# Patient Record
Sex: Female | Born: 1980 | Race: Black or African American | Hispanic: No | Marital: Single | Smoking: Former smoker
Health system: Southern US, Community
[De-identification: ages and names within clinical notes are randomized; demographics above are authoritative.]

## PROBLEM LIST (undated history)

## (undated) DIAGNOSIS — R519 Headache, unspecified: Secondary | ICD-10-CM

## (undated) DIAGNOSIS — Z87891 Personal history of nicotine dependence: Secondary | ICD-10-CM

## (undated) DIAGNOSIS — M1612 Unilateral primary osteoarthritis, left hip: Secondary | ICD-10-CM

## (undated) DIAGNOSIS — G8929 Other chronic pain: Secondary | ICD-10-CM

## (undated) DIAGNOSIS — N83201 Unspecified ovarian cyst, right side: Secondary | ICD-10-CM

## (undated) DIAGNOSIS — G4700 Insomnia, unspecified: Secondary | ICD-10-CM

## (undated) DIAGNOSIS — Z8669 Personal history of other diseases of the nervous system and sense organs: Secondary | ICD-10-CM

## (undated) DIAGNOSIS — M87059 Idiopathic aseptic necrosis of unspecified femur: Secondary | ICD-10-CM

## (undated) DIAGNOSIS — E538 Deficiency of other specified B group vitamins: Secondary | ICD-10-CM

## (undated) DIAGNOSIS — E559 Vitamin D deficiency, unspecified: Secondary | ICD-10-CM

## (undated) DIAGNOSIS — M25559 Pain in unspecified hip: Secondary | ICD-10-CM

## (undated) DIAGNOSIS — D649 Anemia, unspecified: Secondary | ICD-10-CM

## (undated) DIAGNOSIS — M1990 Unspecified osteoarthritis, unspecified site: Secondary | ICD-10-CM

## (undated) DIAGNOSIS — N289 Disorder of kidney and ureter, unspecified: Secondary | ICD-10-CM

## (undated) DIAGNOSIS — R1900 Intra-abdominal and pelvic swelling, mass and lump, unspecified site: Secondary | ICD-10-CM

## (undated) HISTORY — PX: BREAST BIOPSY: SHX20

## (undated) HISTORY — PX: KIDNEY SURGERY: SHX687

## (undated) HISTORY — PX: ABDOMINAL HYSTERECTOMY: SHX81

---
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
## 2004-03-02 ENCOUNTER — Emergency Department: Payer: Self-pay | Admitting: Emergency Medicine

## 2004-04-10 ENCOUNTER — Emergency Department: Payer: Self-pay | Admitting: General Practice

## 2004-04-25 ENCOUNTER — Emergency Department: Payer: Self-pay | Admitting: General Practice

## 2004-06-12 ENCOUNTER — Emergency Department: Payer: Self-pay | Admitting: Emergency Medicine

## 2004-07-18 ENCOUNTER — Inpatient Hospital Stay: Payer: Self-pay | Admitting: Obstetrics and Gynecology

## 2004-11-22 ENCOUNTER — Emergency Department: Payer: Self-pay | Admitting: General Practice

## 2005-11-09 ENCOUNTER — Emergency Department: Payer: Self-pay | Admitting: Emergency Medicine

## 2006-02-04 ENCOUNTER — Emergency Department: Payer: Self-pay | Admitting: Emergency Medicine

## 2006-03-15 ENCOUNTER — Emergency Department: Payer: Self-pay | Admitting: Internal Medicine

## 2006-04-14 ENCOUNTER — Emergency Department: Payer: Self-pay | Admitting: Emergency Medicine

## 2006-06-24 ENCOUNTER — Emergency Department: Payer: Self-pay | Admitting: Unknown Physician Specialty

## 2006-06-26 ENCOUNTER — Emergency Department: Payer: Self-pay | Admitting: Internal Medicine

## 2006-08-16 ENCOUNTER — Emergency Department: Payer: Self-pay | Admitting: Unknown Physician Specialty

## 2006-10-18 ENCOUNTER — Emergency Department: Payer: Self-pay | Admitting: Emergency Medicine

## 2007-02-03 ENCOUNTER — Emergency Department: Payer: Self-pay

## 2007-03-14 ENCOUNTER — Emergency Department: Payer: Self-pay | Admitting: Emergency Medicine

## 2007-03-25 ENCOUNTER — Emergency Department: Payer: Self-pay | Admitting: Emergency Medicine

## 2007-05-09 ENCOUNTER — Emergency Department: Payer: Self-pay | Admitting: Emergency Medicine

## 2007-05-23 ENCOUNTER — Emergency Department: Payer: Self-pay | Admitting: Internal Medicine

## 2007-05-25 ENCOUNTER — Emergency Department: Payer: Self-pay | Admitting: Emergency Medicine

## 2007-06-22 ENCOUNTER — Observation Stay: Payer: Self-pay

## 2007-07-15 ENCOUNTER — Observation Stay: Payer: Self-pay | Admitting: Obstetrics and Gynecology

## 2007-07-19 ENCOUNTER — Inpatient Hospital Stay: Payer: Self-pay | Admitting: Obstetrics and Gynecology

## 2008-03-10 ENCOUNTER — Emergency Department: Payer: Self-pay | Admitting: Unknown Physician Specialty

## 2008-11-05 ENCOUNTER — Emergency Department: Payer: Self-pay | Admitting: Emergency Medicine

## 2010-03-24 ENCOUNTER — Emergency Department: Payer: Self-pay | Admitting: Emergency Medicine

## 2010-04-19 ENCOUNTER — Emergency Department: Payer: Self-pay | Admitting: Emergency Medicine

## 2010-04-30 ENCOUNTER — Emergency Department: Payer: Self-pay | Admitting: Emergency Medicine

## 2010-05-24 ENCOUNTER — Ambulatory Visit: Payer: Self-pay | Admitting: General Surgery

## 2010-05-26 LAB — PATHOLOGY REPORT

## 2010-05-27 ENCOUNTER — Emergency Department: Payer: Self-pay | Admitting: Internal Medicine

## 2010-06-10 ENCOUNTER — Ambulatory Visit: Payer: Self-pay

## 2010-06-14 ENCOUNTER — Ambulatory Visit: Payer: Self-pay | Admitting: Internal Medicine

## 2010-06-22 ENCOUNTER — Ambulatory Visit: Payer: Self-pay | Admitting: Internal Medicine

## 2010-07-13 ENCOUNTER — Emergency Department: Payer: Self-pay | Admitting: Emergency Medicine

## 2010-08-04 ENCOUNTER — Emergency Department: Payer: Self-pay | Admitting: Unknown Physician Specialty

## 2010-08-11 ENCOUNTER — Emergency Department: Payer: Self-pay | Admitting: Emergency Medicine

## 2010-09-20 ENCOUNTER — Ambulatory Visit: Payer: Self-pay

## 2010-10-15 ENCOUNTER — Emergency Department: Payer: Self-pay | Admitting: Emergency Medicine

## 2010-11-12 ENCOUNTER — Emergency Department: Payer: Self-pay | Admitting: Internal Medicine

## 2010-11-19 ENCOUNTER — Emergency Department: Payer: Self-pay | Admitting: Emergency Medicine

## 2011-05-07 ENCOUNTER — Emergency Department: Payer: Self-pay | Admitting: *Deleted

## 2011-05-07 LAB — CBC
HCT: 38.8 % (ref 35.0–47.0)
HGB: 13.1 g/dL (ref 12.0–16.0)
MCHC: 33.7 g/dL (ref 32.0–36.0)
MCV: 101 fL — ABNORMAL HIGH (ref 80–100)
Platelet: 285 10*3/uL (ref 150–440)
RDW: 12.9 % (ref 11.5–14.5)
WBC: 4.8 10*3/uL (ref 3.6–11.0)

## 2011-05-07 LAB — URINALYSIS, COMPLETE
Glucose,UR: NEGATIVE mg/dL (ref 0–75)
Ketone: NEGATIVE
Nitrite: POSITIVE
Ph: 7 (ref 4.5–8.0)
Protein: NEGATIVE
RBC,UR: 1 /HPF (ref 0–5)
Squamous Epithelial: 3
WBC UR: 1 /HPF (ref 0–5)

## 2011-05-07 LAB — COMPREHENSIVE METABOLIC PANEL
Albumin: 4.2 g/dL (ref 3.4–5.0)
Alkaline Phosphatase: 71 U/L (ref 50–136)
BUN: 12 mg/dL (ref 7–18)
Bilirubin,Total: 0.7 mg/dL (ref 0.2–1.0)
EGFR (Non-African Amer.): >60
Glucose: 85 mg/dL (ref 65–99)
Osmolality: 275 (ref 275–301)
Potassium: 3.8 mmol/L (ref 3.5–5.1)
SGPT (ALT): 20 U/L
Sodium: 138 mmol/L (ref 136–145)
Total Protein: 7.9 g/dL (ref 6.4–8.2)

## 2011-05-07 LAB — LIPASE, BLOOD: Lipase: 96 U/L (ref 73–393)

## 2011-05-07 LAB — PREGNANCY, URINE: Pregnancy Test, Urine: NEGATIVE m[IU]/mL

## 2011-08-01 ENCOUNTER — Emergency Department: Payer: Self-pay

## 2011-08-01 LAB — URINALYSIS, COMPLETE
Bacteria: NONE SEEN
Glucose,UR: NEGATIVE mg/dL (ref 0–75)
Protein: NEGATIVE
Squamous Epithelial: 1

## 2011-08-16 ENCOUNTER — Ambulatory Visit: Payer: Self-pay

## 2011-10-26 ENCOUNTER — Emergency Department: Payer: Self-pay | Admitting: Emergency Medicine

## 2012-02-18 ENCOUNTER — Emergency Department: Payer: Self-pay | Admitting: Emergency Medicine

## 2012-02-18 LAB — BASIC METABOLIC PANEL
Anion Gap: 6 — ABNORMAL LOW (ref 7–16)
Calcium, Total: 8.9 mg/dL (ref 8.5–10.1)
Chloride: 106 mmol/L (ref 98–107)
Co2: 27 mmol/L (ref 21–32)
EGFR (African American): >60
Glucose: 98 mg/dL (ref 65–99)

## 2012-02-18 LAB — CBC
HGB: 14 g/dL (ref 12.0–16.0)
MCH: 35.2 pg — ABNORMAL HIGH (ref 26.0–34.0)
MCHC: 35.1 g/dL (ref 32.0–36.0)
Platelet: 305 10*3/uL (ref 150–440)
RBC: 3.97 10*6/uL (ref 3.80–5.20)
RDW: 12.3 % (ref 11.5–14.5)
WBC: 5.1 10*3/uL (ref 3.6–11.0)

## 2012-02-18 LAB — TSH: Thyroid Stimulating Horm: 0.469 u[IU]/mL

## 2012-08-21 ENCOUNTER — Emergency Department: Payer: Self-pay | Admitting: Emergency Medicine

## 2012-08-21 LAB — COMPREHENSIVE METABOLIC PANEL
Albumin: 4 g/dL (ref 3.4–5.0)
Anion Gap: 6 — ABNORMAL LOW (ref 7–16)
Bilirubin,Total: 0.3 mg/dL (ref 0.2–1.0)
Chloride: 106 mmol/L (ref 98–107)
Co2: 25 mmol/L (ref 21–32)
Creatinine: 0.76 mg/dL (ref 0.60–1.30)
EGFR (African American): >60
Glucose: 88 mg/dL (ref 65–99)
Osmolality: 272 (ref 275–301)
Potassium: 3.7 mmol/L (ref 3.5–5.1)
Total Protein: 7.4 g/dL (ref 6.4–8.2)

## 2012-08-21 LAB — URINALYSIS, COMPLETE
Ketone: NEGATIVE
Nitrite: POSITIVE
Ph: 6 (ref 4.5–8.0)
RBC,UR: 1 /HPF (ref 0–5)
Specific Gravity: 1.02 (ref 1.003–1.030)
Squamous Epithelial: 2

## 2012-08-21 LAB — CBC
HCT: 39.8 % (ref 35.0–47.0)
HGB: 13.7 g/dL (ref 12.0–16.0)
MCH: 33.8 pg (ref 26.0–34.0)
MCV: 98 fL (ref 80–100)
RDW: 12.5 % (ref 11.5–14.5)
WBC: 7.9 10*3/uL (ref 3.6–11.0)

## 2012-08-22 LAB — PROTIME-INR
INR: 1
Prothrombin Time: 13 s

## 2012-08-22 LAB — CK: CK, Total: 96 U/L

## 2012-08-22 LAB — PREGNANCY, URINE: Pregnancy Test, Urine: NEGATIVE m[IU]/mL

## 2012-08-22 LAB — APTT: Activated PTT: 30.1 s

## 2013-06-06 ENCOUNTER — Emergency Department: Payer: Self-pay | Admitting: Emergency Medicine

## 2013-06-06 LAB — COMPREHENSIVE METABOLIC PANEL
ALT: 17 U/L (ref 12–78)
ANION GAP: 7 (ref 7–16)
AST: 11 U/L — AB (ref 15–37)
Albumin: 3.8 g/dL (ref 3.4–5.0)
Alkaline Phosphatase: 68 U/L
BILIRUBIN TOTAL: 0.7 mg/dL (ref 0.2–1.0)
BUN: 13 mg/dL (ref 7–18)
CALCIUM: 8.7 mg/dL (ref 8.5–10.1)
CO2: 24 mmol/L (ref 21–32)
Chloride: 107 mmol/L (ref 98–107)
Creatinine: 0.92 mg/dL (ref 0.60–1.30)
EGFR (African American): >60
Glucose: 94 mg/dL (ref 65–99)
OSMOLALITY: 276 (ref 275–301)
Potassium: 3.7 mmol/L (ref 3.5–5.1)
SODIUM: 138 mmol/L (ref 136–145)
Total Protein: 7.3 g/dL (ref 6.4–8.2)

## 2013-06-06 LAB — CBC WITH DIFFERENTIAL/PLATELET
Basophil #: 0.1 x10 3/mm 3
Basophil %: 0.7 %
Eosinophil #: 0.1 x10 3/mm 3
Eosinophil %: 1.1 %
HCT: 38.7 %
HGB: 13.1 g/dL
Lymphocyte %: 24.3 %
Lymphs Abs: 2.3 x10 3/mm 3
MCH: 33.9 pg
MCHC: 33.8 g/dL
MCV: 100 fL
Monocyte #: 0.6 "x10 3/mm "
Monocyte %: 6.7 %
Neutrophil #: 6.5 x10 3/mm 3
Neutrophil %: 67.2 %
Platelet: 246 x10 3/mm 3
RBC: 3.86 X10 6/mm 3
RDW: 12.8 %
WBC: 9.6 x10 3/mm 3

## 2013-06-06 LAB — URINALYSIS, COMPLETE
Bilirubin,UR: NEGATIVE
Blood: NEGATIVE
Glucose,UR: NEGATIVE mg/dL
Ketone: NEGATIVE
Nitrite: NEGATIVE
Ph: 6
Protein: NEGATIVE
RBC,UR: 3 /HPF
Specific Gravity: 1.023
Squamous Epithelial: 3
WBC UR: 5 /HPF

## 2013-06-06 LAB — WET PREP, GENITAL

## 2013-06-06 LAB — TROPONIN I: Troponin-I: <0.02 ng/mL

## 2013-06-06 LAB — GC/CHLAMYDIA PROBE AMP

## 2013-06-06 LAB — LIPASE, BLOOD: Lipase: 117 U/L (ref 73–393)

## 2014-03-30 ENCOUNTER — Emergency Department: Payer: Self-pay | Admitting: Emergency Medicine

## 2014-03-30 LAB — COMPREHENSIVE METABOLIC PANEL
ALK PHOS: 82 U/L
ALT: 19 U/L
Albumin: 4 g/dL (ref 3.4–5.0)
Anion Gap: 11 (ref 7–16)
BILIRUBIN TOTAL: 0.7 mg/dL (ref 0.2–1.0)
BUN: 7 mg/dL (ref 7–18)
Calcium, Total: 8.7 mg/dL (ref 8.5–10.1)
Chloride: 106 mmol/L (ref 98–107)
Co2: 20 mmol/L — ABNORMAL LOW (ref 21–32)
Creatinine: 0.67 mg/dL (ref 0.60–1.30)
Glucose: 125 mg/dL — ABNORMAL HIGH (ref 65–99)
Osmolality: 273 (ref 275–301)
Potassium: 3.3 mmol/L — ABNORMAL LOW (ref 3.5–5.1)
SGOT(AST): 21 U/L (ref 15–37)
Sodium: 137 mmol/L (ref 136–145)
TOTAL PROTEIN: 7.6 g/dL (ref 6.4–8.2)

## 2014-03-30 LAB — CBC WITH DIFFERENTIAL/PLATELET
BASOS PCT: 0.6 %
Basophil #: 0 10*3/uL (ref 0.0–0.1)
Eosinophil #: 0.1 10*3/uL (ref 0.0–0.7)
Eosinophil %: 1 %
HCT: 38.8 % (ref 35.0–47.0)
HGB: 13 g/dL (ref 12.0–16.0)
Lymphocyte #: 1.9 10*3/uL (ref 1.0–3.6)
Lymphocyte %: 28.2 %
MCH: 33.7 pg (ref 26.0–34.0)
MCHC: 33.5 g/dL (ref 32.0–36.0)
MCV: 101 fL — ABNORMAL HIGH (ref 80–100)
MONO ABS: 0.5 x10 3/mm (ref 0.2–0.9)
Monocyte %: 7.5 %
Neutrophil #: 4.2 10*3/uL (ref 1.4–6.5)
Neutrophil %: 62.7 %
Platelet: 262 10*3/uL (ref 150–440)
RBC: 3.86 10*6/uL (ref 3.80–5.20)
RDW: 12.6 % (ref 11.5–14.5)
WBC: 6.7 10*3/uL (ref 3.6–11.0)

## 2014-03-30 LAB — URINALYSIS, COMPLETE
BACTERIA: NONE SEEN
Bilirubin,UR: NEGATIVE
Blood: NEGATIVE
Glucose,UR: NEGATIVE mg/dL (ref 0–75)
KETONE: NEGATIVE
Leukocyte Esterase: NEGATIVE
Nitrite: NEGATIVE
Ph: 6 (ref 4.5–8.0)
Protein: NEGATIVE
RBC,UR: 2 /HPF (ref 0–5)
SPECIFIC GRAVITY: 1.021 (ref 1.003–1.030)
WBC UR: 6 /HPF (ref 0–5)

## 2014-03-30 LAB — PREGNANCY, URINE: Pregnancy Test, Urine: POSITIVE m[IU]/mL

## 2014-03-30 LAB — HCG, QUANTITATIVE, PREGNANCY: BETA HCG, QUANT.: 50020 m[IU]/mL — AB

## 2014-04-12 ENCOUNTER — Emergency Department: Payer: Self-pay | Admitting: Emergency Medicine

## 2014-04-12 LAB — CBC
HCT: 37.5 % (ref 35.0–47.0)
HGB: 12.5 g/dL (ref 12.0–16.0)
MCH: 33.9 pg (ref 26.0–34.0)
MCHC: 33.4 g/dL (ref 32.0–36.0)
MCV: 101 fL — ABNORMAL HIGH (ref 80–100)
Platelet: 277 10*3/uL (ref 150–440)
RBC: 3.7 10*6/uL — AB (ref 3.80–5.20)
RDW: 12.7 % (ref 11.5–14.5)
WBC: 8.4 10*3/uL (ref 3.6–11.0)

## 2014-04-12 LAB — COMPREHENSIVE METABOLIC PANEL
Albumin: 3.7 g/dL (ref 3.4–5.0)
Alkaline Phosphatase: 64 U/L
Anion Gap: 9 (ref 7–16)
BUN: 7 mg/dL (ref 7–18)
Bilirubin,Total: 0.5 mg/dL (ref 0.2–1.0)
CHLORIDE: 103 mmol/L (ref 98–107)
Calcium, Total: 8.8 mg/dL (ref 8.5–10.1)
Co2: 24 mmol/L (ref 21–32)
Creatinine: 0.55 mg/dL — ABNORMAL LOW (ref 0.60–1.30)
EGFR (African American): >60
GLUCOSE: 106 mg/dL — AB (ref 65–99)
Osmolality: 270 (ref 275–301)
POTASSIUM: 3.4 mmol/L — AB (ref 3.5–5.1)
SGOT(AST): 27 U/L (ref 15–37)
SGPT (ALT): 31 U/L
SODIUM: 136 mmol/L (ref 136–145)
Total Protein: 7.4 g/dL (ref 6.4–8.2)

## 2014-04-12 LAB — URINALYSIS, COMPLETE
BACTERIA: NONE SEEN
BILIRUBIN, UR: NEGATIVE
BLOOD: NEGATIVE
GLUCOSE, UR: NEGATIVE mg/dL (ref 0–75)
LEUKOCYTE ESTERASE: NEGATIVE
Nitrite: NEGATIVE
Ph: 5 (ref 4.5–8.0)
Protein: 30
Specific Gravity: 1.03 (ref 1.003–1.030)
WBC UR: NONE SEEN /HPF (ref 0–5)

## 2014-06-14 ENCOUNTER — Emergency Department: Payer: Self-pay | Admitting: Emergency Medicine

## 2014-11-30 ENCOUNTER — Emergency Department
Admission: EM | Admit: 2014-11-30 | Discharge: 2014-11-30 | Disposition: A | Discharge status: Home / Self Care | Payer: Medicaid Other | Attending: Emergency Medicine | Admitting: Emergency Medicine

## 2014-11-30 ENCOUNTER — Encounter: Payer: Self-pay | Admitting: Emergency Medicine

## 2014-11-30 DIAGNOSIS — Z720 Tobacco use: Secondary | ICD-10-CM | POA: Diagnosis not present

## 2014-11-30 DIAGNOSIS — Z79899 Other long term (current) drug therapy: Secondary | ICD-10-CM | POA: Insufficient documentation

## 2014-11-30 DIAGNOSIS — Z3202 Encounter for pregnancy test, result negative: Secondary | ICD-10-CM | POA: Insufficient documentation

## 2014-11-30 DIAGNOSIS — R109 Unspecified abdominal pain: Secondary | ICD-10-CM | POA: Insufficient documentation

## 2014-11-30 DIAGNOSIS — G8918 Other acute postprocedural pain: Secondary | ICD-10-CM

## 2014-11-30 LAB — URINALYSIS COMPLETE WITH MICROSCOPIC (ARMC ONLY)
Bilirubin Urine: NEGATIVE
GLUCOSE, UA: NEGATIVE mg/dL
Hgb urine dipstick: NEGATIVE
KETONES UR: NEGATIVE mg/dL
LEUKOCYTES UA: NEGATIVE
NITRITE: NEGATIVE
Protein, ur: NEGATIVE mg/dL
Specific Gravity, Urine: 1.01 (ref 1.005–1.030)
pH: 7 (ref 5.0–8.0)

## 2014-11-30 MED ORDER — OXYCODONE-ACETAMINOPHEN 5-325 MG PO TABS: 1.0000 | ORAL_TABLET | Freq: Four times a day (QID) | ORAL | Status: AC | PRN

## 2014-11-30 NOTE — ED Provider Notes (Signed)
Time Seen: Approximately 1110  I have reviewed the triage notes  Chief Complaint: Post-op Problem   History of Present Illness: Alice Andrews is a 34 y.o. female who presents with some persistent pain over her C-section site. Patient states this is her fifth C-section and she was told that she would have more increased pain than usual because he had to cut through a "" a lot of scar tissue. Patient states she has noticed persistent pain over this site and also some mild pain where lumbar puncture site was performed. She states when he tried or LP she was moving and was told that she may have discomfort over the site. She states she felt very anxious when have the procedure done. Patient's denies any fever, decreased bowel movements, focal weakness or any other new concerns. She still has some very small vaginal bleeding.   History reviewed. No pertinent past medical history.  There are no active problems to display for this patient.   Past Surgical History  Procedure Laterality Date  . Cesarean section    . Kidney surgery      stent placed and then removed.     Past Surgical History  Procedure Laterality Date  . Cesarean section    . Kidney surgery      stent placed and then removed.     Current Outpatient Rx  Name  Route  Sig  Dispense  Refill  . ibuprofen (ADVIL,MOTRIN) 800 MG tablet   Oral   Take 800 mg by mouth every 8 (eight) hours as needed.         . Prenatal Vit-Fe Fumarate-FA (PRENATAL MULTIVITAMIN) TABS tablet   Oral   Take 1 tablet by mouth daily at 12 noon.         Marland Kitchen oxyCODONE-acetaminophen (ROXICET) 5-325 MG per tablet   Oral   Take 1 tablet by mouth every 6 (six) hours as needed.   20 tablet   0     Allergies:  Review of patient's allergies indicates no known allergies.  Family History: History reviewed. No pertinent family history.  Social History: Social History  Substance Use Topics  . Smoking status: Current Some Day Smoker  .  Smokeless tobacco: None  . Alcohol Use: No     Review of Systems:   10 point review of systems was performed and was otherwise negative:  Constitutional: No fever Eyes: No visual disturbances ENT: No sore throat, ear pain Cardiac: No chest pain Respiratory: No shortness of breath, wheezing, or stridor Abdomen: No abdominal pain, no vomiting, No diarrhea Endocrine: No weight loss, No night sweats Extremities: No peripheral edema, cyanosis Skin: No rashes, easy bruising Neurologic: No focal weakness, trouble with speech or swollowing Urologic: No dysuria, Hematuria, or urinary frequency   Physical Exam:  ED Triage Vitals  Enc Vitals Group     BP 11/30/14 1027 124/87 mmHg     Pulse Rate 11/30/14 1027 83     Resp 11/30/14 1027 20     Temp 11/30/14 1027 98 F (36.7 C)     Temp src --      SpO2 11/30/14 1027 100 %     Weight 11/30/14 1027 142 lb (64.411 kg)     Height 11/30/14 1027 5' (1.524 m)     Head Cir --      Peak Flow --      Pain Score 11/30/14 1028 10     Pain Loc --      Pain  Edu? --      Excl. in GC? --     General: Awake , Alert , and Oriented times 3; GCS 15 Head: Normal cephalic , atraumatic Eyes: Pupils equal , round, reactive to light Nose/Throat: No nasal drainage, patent upper airway without erythema or exudate.  Neck: Supple, Full range of motion, No anterior adenopathy or palpable thyroid masses Lungs: Clear to ascultation without wheezes , rhonchi, or rales Heart: Regular rate, regular rhythm without murmurs , gallops , or rubs Abdomen: Mild tenderness with reproducible pain over the C-section site without any peritoneal signs. The wound appears well-healed well aligned. No focal tenderness over McBurney's point..        Extremities: 2 plus symmetric pulses. No edema, clubbing or cyanosis Neurologic: normal ambulation, Motor symmetric without deficits, sensory intact Skin: warm, dry, no rashes Mild tenderness posteriorly over her LP site without  any palpable masses or induration.  Labs:   All laboratory work was reviewed including any pertinent negatives or positives listed below:  Labs Reviewed  URINALYSIS COMPLETEWITH MICROSCOPIC ([facility] ONLY) - Abnormal; Notable for the following:    Color, Urine YELLOW (*)    APPearance HAZY (*)    Bacteria, UA MANY (*)    Squamous Epithelial / LPF 6-30 (*)    All other components within normal limits  POC URINE PREG, ED   urine appeared to be a poor sample and the patient's currently having her some discharge postoperatively. Pregnancy test was negative.    ED Course: Patient's stay was uneventful and she was discharged with prescription for pain medication. She does have follow-up on September 6 with her OB/GYN. She was advised to return here if she has any other new concerns such as a fever, persistent vomiting, or any other new concerns. She denies any urinary or urinary symptoms and I felt this was most likely a poor sample due to her post C-section vaginal bleeding     Assessment:  Postoperative pain     Plan: *Patient discharged with follow up as recommended. She was given postoperative pain.            Jennye Moccasin, MD 11/30/14 [phone]

## 2014-11-30 NOTE — ED Notes (Signed)
U preg was negative

## 2014-11-30 NOTE — ED Notes (Signed)
Reports had 4th c-section 1 month ago.  States pain at scar.  Also having pain in back at site where spinal was.

## 2014-11-30 NOTE — Discharge Instructions (Signed)
Pain Relief Preoperatively and Postoperatively °Being a good patient does not mean being a silent one. If you have questions, problems, or concerns about the pain you may feel after surgery, let your caregiver know. Patients have the right to assessment and management of pain. The treatment of pain after surgery is important to speed up recovery and return to normal activities. Severe pain after surgery, and the fear or anxiety associated with that pain, may cause extreme discomfort that: °· Prevents sleep. °· Decreases the ability to breathe deeply and cough. This can cause pneumonia or other upper airway infections. °· Causes your heart to beat faster and your blood pressure to be higher. °· Increases the risk for constipation and bloating. °· Decreases the ability of wounds to heal. °· May result in depression, increased anxiety, and feelings of helplessness. °Relief of pain before surgery is also important because it will lessen the pain after surgery. Patients who receive both pain relief before and after surgery experience greater pain relief than those who only receive pain relief after surgery. Let your caregiver know if you are having uncontrolled pain. This is very important. Pain after surgery is more difficult to manage if it is permitted to become severe, so prompt and adequate treatment of acute pain is necessary. °PAIN CONTROL METHODS °Your caregivers follow policies and procedures about the management of patient pain. These guidelines should be explained to you before surgery. Plans for pain control after surgery must be mutually decided upon and instituted with your full understanding and agreement. Do not be afraid to ask questions regarding the care you are receiving. There are many different ways your caregivers will attempt to control your pain, including the following methods. °As needed pain control °· You may be given pain medicine either through your intravenous (IV) tube, or as a pill or  liquid you can swallow. You will need to let your caregiver know when you are having pain. Then, your caregiver will give you the pain medicine ordered for you. °· Your pain medicine may make you constipated. If constipation occurs, drink more liquids if you can. Your caregiver may have you take a mild laxative. °IV patient-controlled analgesia pump (PCA pump) °· You can get your pain medicine through the IV tube which goes into your vein. You are able to control the amount of pain medicine that you get. The pain medicine flows in through an IV tube and is controlled by a pump. This pump gives you a set amount of pain medicine when you push the button hooked up to it. Nobody should push this button but you or someone specifically assigned by you to do so. It is set up to keep you from accidentally giving yourself too much pain medicine. You will be able to start using your pain pump in the recovery room after your surgery. This method can be helpful for most types of surgery. °· If you are still having too much pain, tell your caregiver. Also, tell your caregiver if you are feeling too sleepy or nauseous. °Continuous epidural pain control °· A thin, soft tube (catheter) is put into your back. Pain medicine flows through the catheter to lessen pain in the part of your body where the surgery is done. Continuous epidural pain control may work best for you if you are having surgery on your chest, abdomen, hip area, or legs. The epidural catheter is usually put into your back just before surgery. The catheter is left in until you can eat and take medicine by mouth. In most cases,   this may take 2 to 3 days.  Giving pain medicine through the epidural catheter may help you heal faster because:  Your bowel gets back to normal faster.  You can get back to eating sooner.  You can be up and walking sooner. Medicine that numbs the area (local anesthetic)  You may receive an injection of pain medicine near where the  pain is (local infiltration).  You may receive an injection of pain medicine near the nerve that controls the sensation to a specific part of the body (peripheral nerve block).  Medicine may be put in the spine to block pain (spinal block). Opioids  Moderate to moderately severe acute pain after surgery may respond to opioids.Opioids are narcotic pain medicine. Opioids are often combined with non-narcotic medicines to improve pain relief, diminish the risk of side effects, and reduce the chance of addiction.  If you follow your caregiver's directions about taking opioids and you do not have a history of substance abuse, your risk of becoming addicted is exceptionally small.Opioids are given for short periods of time in careful doses to prevent addiction. Other methods of pain control include:  Steroids.  Physical therapy.  Heat and cold therapy.  Compression, such as wrapping an elastic bandage around the area of pain.  Massage. These various ways of controlling pain may be used together. Combining different methods of pain control is called multimodal analgesia. Using this approach has many benefits, including being able to eat, move around, and leave the hospital sooner. Document Released: 06/16/2002 Document Revised: 06/18/2011 Document Reviewed: 06/20/2010 [facility] Patient Information 2015 [place], [place]. This information is not intended to replace advice given to you by your health care provider. Make sure you discuss any questions you have with your health care provider.  Please return immediately if condition worsens. Please contact her primary physician or the physician you were given for referral. If you have any specialist physicians involved in her treatment and plan please also contact them. Thank you for using [facility].

## 2014-12-01 LAB — POCT PREGNANCY, URINE: Preg Test, Ur: NEGATIVE

## 2015-03-29 ENCOUNTER — Emergency Department: Payer: Medicaid Other

## 2015-03-29 ENCOUNTER — Emergency Department
Admission: EM | Admit: 2015-03-29 | Discharge: 2015-03-29 | Disposition: A | Discharge status: Home / Self Care | Payer: Medicaid Other | Attending: Emergency Medicine | Admitting: Emergency Medicine

## 2015-03-29 ENCOUNTER — Encounter: Payer: Self-pay | Admitting: *Deleted

## 2015-03-29 DIAGNOSIS — Y998 Other external cause status: Secondary | ICD-10-CM | POA: Diagnosis not present

## 2015-03-29 DIAGNOSIS — Y9389 Activity, other specified: Secondary | ICD-10-CM | POA: Diagnosis not present

## 2015-03-29 DIAGNOSIS — Z87891 Personal history of nicotine dependence: Secondary | ICD-10-CM | POA: Insufficient documentation

## 2015-03-29 DIAGNOSIS — S80211A Abrasion, right knee, initial encounter: Secondary | ICD-10-CM | POA: Insufficient documentation

## 2015-03-29 DIAGNOSIS — Y9289 Other specified places as the place of occurrence of the external cause: Secondary | ICD-10-CM | POA: Diagnosis not present

## 2015-03-29 DIAGNOSIS — S0990XA Unspecified injury of head, initial encounter: Secondary | ICD-10-CM | POA: Diagnosis not present

## 2015-03-29 DIAGNOSIS — Z79899 Other long term (current) drug therapy: Secondary | ICD-10-CM | POA: Insufficient documentation

## 2015-03-29 DIAGNOSIS — S199XXA Unspecified injury of neck, initial encounter: Secondary | ICD-10-CM | POA: Diagnosis present

## 2015-03-29 DIAGNOSIS — F0781 Postconcussional syndrome: Secondary | ICD-10-CM | POA: Diagnosis not present

## 2015-03-29 DIAGNOSIS — S161XXA Strain of muscle, fascia and tendon at neck level, initial encounter: Secondary | ICD-10-CM | POA: Diagnosis not present

## 2015-03-29 MED ORDER — NAPROXEN 500 MG PO TABS: 500.0000 mg | ORAL_TABLET | Freq: Two times a day (BID) | ORAL | Status: DC

## 2015-03-29 MED ORDER — CYCLOBENZAPRINE HCL 10 MG PO TABS: 10.0000 mg | ORAL_TABLET | Freq: Every day | ORAL | Status: DC

## 2015-03-29 MED ORDER — KETOROLAC TROMETHAMINE 30 MG/ML IJ SOLN
30.0000 mg | Freq: Once | INTRAMUSCULAR | Status: AC
  Administered 2015-03-29: 30 mg via INTRAMUSCULAR
  Filled 2015-03-29: qty 1

## 2015-03-29 NOTE — Discharge Instructions (Signed)
Cervical Sprain A cervical sprain is when the tissues (ligaments) that hold the neck bones in place stretch or tear. HOME CARE   Put ice on the injured area.  Put ice in a plastic bag.  Place a towel between your skin and the bag.  Leave the ice on for 15-20 minutes, 3-4 times a day.  You may have been given a collar to wear. This collar keeps your neck from moving while you heal.  Do not take the collar off unless told by your doctor.  If you have long hair, keep it outside of the collar.  Ask your doctor before changing the position of your collar. You may need to change its position over time to make it more comfortable.  If you are allowed to take off the collar for cleaning or bathing, follow your doctor's instructions on how to do it safely.  Keep your collar clean by wiping it with mild soap and water. Dry it completely. If the collar has removable pads, remove them every 1-2 days to hand wash them with soap and water. Allow them to air dry. They should be dry before you wear them in the collar.  Do not drive while wearing the collar.  Only take medicine as told by your doctor.  Keep all doctor visits as told.  Keep all physical therapy visits as told.  Adjust your work station so that you have good posture while you work.  Avoid positions and activities that make your problems worse.  Warm up and stretch before being active. GET HELP IF:  Your pain is not controlled with medicine.  You cannot take less pain medicine over time as planned.  Your activity level does not improve as expected. GET HELP RIGHT AWAY IF:   You are bleeding.  Your stomach is upset.  You have an allergic reaction to your medicine.  You develop new problems that you cannot explain.  You lose feeling (become numb) or you cannot move any part of your body (paralysis).  You have tingling or weakness in any part of your body.  Your symptoms get worse. Symptoms include:  Pain,  soreness, stiffness, puffiness (swelling), or a burning feeling in your neck.  Pain when your neck is touched.  Shoulder or upper back pain.  Limited ability to move your neck.  Headache.  Dizziness.  Your hands or arms feel week, lose feeling, or tingle.  Muscle spasms.  Difficulty swallowing or chewing. MAKE SURE YOU:   Understand these instructions.  Will watch your condition.  Will get help right away if you are not doing well or get worse.   This information is not intended to replace advice given to you by your health care provider. Make sure you discuss any questions you have with your health care provider.   Document Released: 09/12/2007 Document Revised: 11/26/2012 Document Reviewed: 10/01/2012 Elsevier Interactive Patient Education 2016 Elsevier Inc.  Abrasion An abrasion is a cut or scrape on the outer surface of your skin. An abrasion does not extend through all of the layers of your skin. It is important to care for your abrasion properly to prevent infection. CAUSES Most abrasions are caused by falling on or gliding across the ground or another surface. When your skin rubs on something, the outer and inner layer of skin rubs off.  SYMPTOMS A cut or scrape is the main symptom of this condition. The scrape may be bleeding, or it may appear red or pink. If there was  an associated fall, there may be an underlying bruise. DIAGNOSIS An abrasion is diagnosed with a physical exam. TREATMENT Treatment for this condition depends on how large and deep the abrasion is. Usually, your abrasion will be cleaned with water and mild soap. This removes any dirt or debris that may be stuck. An antibiotic ointment may be applied to the abrasion to help prevent infection. A bandage (dressing) may be placed on the abrasion to keep it clean. You may also need a tetanus shot. HOME CARE INSTRUCTIONS Medicines  Take or apply medicines only as directed by your health care  provider.  If you were prescribed an antibiotic ointment, finish all of it even if you start to feel better. Wound Care  Clean the wound with mild soap and water 2-3 times per day or as directed by your health care provider. Pat your wound dry with a clean towel. Do not rub it.  There are many different ways to close and cover a wound. Follow instructions from your health care provider about:  Wound care.  Dressing changes and removal.  Check your wound every day for signs of infection. Watch for:  Redness, swelling, or pain.  Fluid, blood, or pus. General Instructions  Keep the dressing dry as directed by your health care provider. Do not take baths, swim, use a hot tub, or do anything that would put your wound underwater until your health care provider approves.  If there is swelling, raise (elevate) the injured area above the level of your heart while you are sitting or lying down.  Keep all follow-up visits as directed by your health care provider. This is important. SEEK MEDICAL CARE IF:  You received a tetanus shot and you have swelling, severe pain, redness, or bleeding at the injection site.  Your pain is not controlled with medicine.  You have increased redness, swelling, or pain at the site of your wound. SEEK IMMEDIATE MEDICAL CARE IF:  You have a red streak going away from your wound.  You have a fever.  You have fluid, blood, or pus coming from your wound.  You notice a bad smell coming from your wound or your dressing.   This information is not intended to replace advice given to you by your health care provider. Make sure you discuss any questions you have with your health care provider.   Document Released: 01/03/2005 Document Revised: 12/15/2014 Document Reviewed: 03/24/2014 Elsevier Interactive Patient Education [employer]2016 Elsevier Inc.
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
## 2015-03-29 NOTE — ED Notes (Signed)
Pt states she receives depo-provera injections and denies pregnancy.
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
## 2017-01-04 ENCOUNTER — Emergency Department
Admission: EM | Admit: 2017-01-04 | Discharge: 2017-01-04 | Disposition: A | Discharge status: Home / Self Care | Payer: Self-pay | Attending: Emergency Medicine | Admitting: Emergency Medicine

## 2017-01-04 ENCOUNTER — Encounter: Payer: Self-pay | Admitting: Emergency Medicine

## 2017-01-04 DIAGNOSIS — Z87891 Personal history of nicotine dependence: Secondary | ICD-10-CM | POA: Insufficient documentation

## 2017-01-04 DIAGNOSIS — Z79899 Other long term (current) drug therapy: Secondary | ICD-10-CM | POA: Insufficient documentation

## 2017-01-04 DIAGNOSIS — L0231 Cutaneous abscess of buttock: Secondary | ICD-10-CM | POA: Insufficient documentation

## 2017-01-04 MED ORDER — LIDOCAINE HCL (PF) 1 % IJ SOLN
5.0000 mL | Freq: Once | INTRAMUSCULAR | Status: DC
  Filled 2017-01-04: qty 5

## 2017-01-04 MED ORDER — LIDOCAINE-EPINEPHRINE-TETRACAINE (LET) SOLUTION
NASAL | Status: AC
  Filled 2017-01-04: qty 3

## 2017-01-04 MED ORDER — HYDROCODONE-ACETAMINOPHEN 5-325 MG PO TABS: 1.0000 | ORAL_TABLET | Freq: Four times a day (QID) | ORAL | 0 refills | Status: DC | PRN

## 2017-01-04 MED ORDER — CLINDAMYCIN HCL 300 MG PO CAPS: 300.0000 mg | ORAL_CAPSULE | Freq: Four times a day (QID) | ORAL | 0 refills | Status: AC

## 2017-01-04 MED ORDER — LIDOCAINE-EPINEPHRINE-TETRACAINE (LET) SOLUTION: 3.0000 mL | Freq: Once | NASAL | Status: DC

## 2017-01-04 MED ORDER — HYDROCODONE-ACETAMINOPHEN 5-325 MG PO TABS
1.0000 | ORAL_TABLET | Freq: Once | ORAL | Status: AC
  Administered 2017-01-04: 1 via ORAL
  Filled 2017-01-04: qty 1

## 2017-01-04 NOTE — ED Triage Notes (Signed)
Presents with possible abscess area to buttocks   States she noticed small area couple of days ago  Now area is larger and more painful

## 2017-01-04 NOTE — ED Provider Notes (Signed)
[facility] Emergency Department Provider Note   ____________________________________________   I have reviewed the triage vital signs and the nursing notes.   HISTORY  Chief Complaint No chief complaint on file.    HPI Alice Andrews is a 36 y.o. female presents to the emergency department with abscess on the right buttock superior to the anal sphincter she noted developing 2-3 days ago. Patient notes worsening pain, swelling and redness without any drainage from the area. Patient reports a past history of abscesses that have required incision and drainage. Patient denies fevers, chills, nausea, vomiting or headache associated with the above symptoms. Patient reports difficulty walking, sitting or bending over as the pain has progressed. Patient denies vision changes, chest pain, chest tightness, shortness of breath or abdominal pain.  History reviewed. No pertinent past medical history.  There are no active problems to display for this patient.   Past Surgical History:  Procedure Laterality Date  . CESAREAN SECTION    . KIDNEY SURGERY     stent placed and then removed.     Prior to Admission medications   Medication Sig Start Date End Date Taking? Authorizing Provider  clindamycin (CLEOCIN) 300 MG capsule Take 1 capsule (300 mg total) by mouth 4 (four) times daily. 01/04/17 01/14/17  Jamira Barfuss M, PA-C  cyclobenzaprine (FLEXERIL) 10 MG tablet Take 1 tablet (10 mg total) by mouth at bedtime. 03/29/15   Christella Scheuermann, PA-C  HYDROcodone-acetaminophen (NORCO/VICODIN) 5-325 MG tablet Take 1 tablet by mouth every 6 (six) hours as needed for moderate pain. 01/04/17   Neeya Prigmore M, PA-C  ibuprofen (ADVIL,MOTRIN) 800 MG tablet Take 800 mg by mouth every 8 (eight) hours as needed. 11/04/14   [provider]  naproxen (NAPROSYN) 500 MG tablet Take 1 tablet (500 mg total) by mouth 2 (two) times daily with a meal. 03/29/15   Christella Scheuermann,  PA-C  Prenatal Vit-Fe Fumarate-FA (PRENATAL MULTIVITAMIN) TABS tablet Take 1 tablet by mouth daily at 12 noon.    [provider]    Allergies Patient has no known allergies.  No family history on file.  Social History Social History  Substance Use Topics  . Smoking status: Former Games developer  . Smokeless tobacco: Never Used  . Alcohol use No    Review of Systems Constitutional: Negative for fever/chills Cardiovascular: Denies chest pain.. Gastrointestinal: No abdominal pain.  No nausea, vomiting, diarrhea. Genitourinary: Negative for dysuria. Musculoskeletal: Negative for back pain. Skin: Negative for rash. Abscess along the right buttock superior to the anal sphincter. Neurological: Negative for headaches.  ____________________________________________   PHYSICAL EXAM:  VITAL SIGNS: ED Triage Vitals  Enc Vitals Group     BP 01/04/17 1221 112/69     Pulse Rate 01/04/17 1221 (!) 103     Resp 01/04/17 1221 20     Temp 01/04/17 1221 99 F (37.2 C)     Temp Source 01/04/17 1221 Oral     SpO2 01/04/17 1221 99 %     Weight --      Height --      Head Circumference --      Peak Flow --      Pain Score 01/04/17 1222 9     Pain Loc --      Pain Edu? --      Excl. in [place]? --     Constitutional: Alert and oriented. Well appearing and in no acute distress.  Eyes: Conjunctivae are normal. PERRL. EOMI  Head: Normocephalic and atraumatic. Cardiovascular: Normal rate, regular rhythm. Good peripheral circulation. Respiratory: Normal respiratory effort without tachypnea or retractions. Hematological/Lymphatic/Immunological: No cervical lymphadenopathy. Cardiovascular: Normal rate, regular rhythm. Normal distal pulses. Gastrointestinal: Bowel sounds 4 quadrants. Soft and nontender to palpation.  Neurologic: Normal speech and language.  Skin:  Skin is warm, dry and intact. No rash noted. Abscess along right buttock-superior to the anal sphincter, 3.0 cm x 4.0 cm erythema and  induration, fluctuance ~1.0 x 1.0 cm.  Psychiatric: Mood and affect are normal. Speech and behavior are normal. Patient exhibits appropriate insight and judgement.  ____________________________________________   LABS (all labs ordered are listed, but only abnormal results are displayed)  Labs Reviewed - No data to display ____________________________________________  EKG none ____________________________________________  RADIOLOGY none ____________________________________________   PROCEDURES  Procedure(s) performed:  _______________________ INCISION AND DRAINAGE  Performed by: Clois Comber Consent: Verbal consent obtained. Risks and benefits: risks, benefits and alternatives were discussed Type: abscess  Body area: right buttock, 3.0 cm x 4.0 cm erythema and induration, fluctuance ~1.0 x 1.0 cm. Anesthesia: local infiltration  Incision was made with a scalpel.  Local anesthetic: lidocaine 1%  Anesthetic total: 5.0 ml  Complexity: complex Blunt dissection to break up loculations  Drainage: purulent  Drainage amount: 15 ml  Packing material: 1/4 in iodoform gauze  Patient tolerance: Patient tolerated the procedure well with no immediate complications.   Critical Care performed: no ____________________________________________   INITIAL IMPRESSION / ASSESSMENT AND PLAN / ED COURSE  Pertinent labs & imaging results that were available during my care of the patient were reviewed by me and considered in my medical decision making (see chart for details). Patient presented with abscess located right buttock-superior to the anal sphincter, 3.0 cm x 4.0 cm . The abscess I&D without complication. Patient will be prescribed  for antibiotic coverage. Patient instructed to keep wound clean, dry and covered with a nonocclusive dressing. Also instructed to return in 2 days for a wound check. Patient informed of clinical course, understand medical decision-making process,  and agree with plan.  Patient was advised to return to the emergency department for symptoms that change or worsen.    ____________________________________________   FINAL CLINICAL IMPRESSION(S) / ED DIAGNOSES  Final diagnoses:  Abscess of buttock       NEW MEDICATIONS STARTED DURING THIS VISIT:  Discharge Medication List as of 01/04/2017  3:11 PM    START taking these medications   Details  clindamycin (CLEOCIN) 300 MG capsule Take 1 capsule (300 mg total) by mouth 4 (four) times daily., Starting Fri 01/04/2017, Until Mon 01/14/2017, Print    HYDROcodone-acetaminophen (NORCO/VICODIN) 5-325 MG tablet Take 1 tablet by mouth every 6 (six) hours as needed for moderate pain., Starting Fri 01/04/2017, Print         Note:  This document was prepared using Dragon voice recognition software and may include unintentional dictation errors.    Clois Comber, PA-C 01/04/17 1623    Dionne Bucy, MD 01/04/17 [phone]

## 2017-01-04 NOTE — Discharge Instructions (Signed)
Follow-up with your primary care in 2 days for a wound recheck or follow up in the emergency department for a wound recheck. There is wound packing in your wound if you note the packing comes out, return to the emergency department to have it repacked.  If you note signs of worsening symptoms or developing infection do not hesitate to return to the emergency department.  Take medication as directed.

## 2018-02-03 ENCOUNTER — Emergency Department: Payer: Self-pay

## 2018-02-03 ENCOUNTER — Encounter: Payer: Self-pay | Admitting: Emergency Medicine

## 2018-02-03 ENCOUNTER — Emergency Department
Admission: EM | Admit: 2018-02-03 | Discharge: 2018-02-04 | Disposition: A | Discharge status: Home / Self Care | Payer: Self-pay | Attending: Emergency Medicine | Admitting: Emergency Medicine

## 2018-02-03 ENCOUNTER — Other Ambulatory Visit: Payer: Self-pay

## 2018-02-03 DIAGNOSIS — Z87891 Personal history of nicotine dependence: Secondary | ICD-10-CM | POA: Insufficient documentation

## 2018-02-03 DIAGNOSIS — N390 Urinary tract infection, site not specified: Secondary | ICD-10-CM

## 2018-02-03 DIAGNOSIS — R52 Pain, unspecified: Secondary | ICD-10-CM

## 2018-02-03 DIAGNOSIS — Z79899 Other long term (current) drug therapy: Secondary | ICD-10-CM | POA: Insufficient documentation

## 2018-02-03 DIAGNOSIS — R109 Unspecified abdominal pain: Secondary | ICD-10-CM

## 2018-02-03 DIAGNOSIS — R1084 Generalized abdominal pain: Secondary | ICD-10-CM | POA: Insufficient documentation

## 2018-02-03 DIAGNOSIS — R1011 Right upper quadrant pain: Secondary | ICD-10-CM | POA: Insufficient documentation

## 2018-02-03 HISTORY — DX: Disorder of kidney and ureter, unspecified: N28.9

## 2018-02-03 LAB — COMPREHENSIVE METABOLIC PANEL
ALBUMIN: 4.4 g/dL (ref 3.5–5.0)
ALK PHOS: 76 U/L (ref 38–126)
ALT: 14 U/L (ref 0–44)
ANION GAP: 6 (ref 5–15)
AST: 17 U/L (ref 15–41)
BILIRUBIN TOTAL: 0.7 mg/dL (ref 0.3–1.2)
BUN: 13 mg/dL (ref 6–20)
CO2: 23 mmol/L (ref 22–32)
Calcium: 9 mg/dL (ref 8.9–10.3)
Chloride: 107 mmol/L (ref 98–111)
Creatinine, Ser: 0.78 mg/dL (ref 0.44–1.00)
GFR calc Af Amer: >60 mL/min (ref >60)
Glucose, Bld: 93 mg/dL (ref 70–99)
POTASSIUM: 4.1 mmol/L (ref 3.5–5.1)
Sodium: 136 mmol/L (ref 135–145)
Total Protein: 7.5 g/dL (ref 6.5–8.1)

## 2018-02-03 LAB — URINALYSIS, COMPLETE (UACMP) WITH MICROSCOPIC
Bilirubin Urine: NEGATIVE
Glucose, UA: NEGATIVE mg/dL
Hgb urine dipstick: NEGATIVE
KETONES UR: NEGATIVE mg/dL
Nitrite: NEGATIVE
PROTEIN: 30 mg/dL — AB
Specific Gravity, Urine: 1.013 (ref 1.005–1.030)
pH: 6 (ref 5.0–8.0)

## 2018-02-03 LAB — CBC
HCT: 40.2 % (ref 36.0–46.0)
HEMOGLOBIN: 13.8 g/dL (ref 12.0–15.0)
MCH: 33.6 pg (ref 26.0–34.0)
MCHC: 34.3 g/dL (ref 30.0–36.0)
MCV: 97.8 fL (ref 80.0–100.0)
Platelets: 273 10*3/uL (ref 150–400)
RBC: 4.11 MIL/uL (ref 3.87–5.11)
RDW: 11.9 % (ref 11.5–15.5)
WBC: 8.3 10*3/uL (ref 4.0–10.5)
nRBC: 0 % (ref 0.0–0.2)

## 2018-02-03 LAB — POCT PREGNANCY, URINE: Preg Test, Ur: NEGATIVE

## 2018-02-03 LAB — LIPASE, BLOOD: Lipase: 26 U/L (ref 11–51)

## 2018-02-03 MED ORDER — SODIUM CHLORIDE 0.9 % IV SOLN
1.0000 g | Freq: Once | INTRAVENOUS | Status: AC
  Administered 2018-02-04: 1 g via INTRAVENOUS

## 2018-02-03 MED ORDER — CEPHALEXIN 500 MG PO CAPS: 500.0000 mg | ORAL_CAPSULE | Freq: Three times a day (TID) | ORAL | 0 refills | Status: DC

## 2018-02-03 NOTE — ED Provider Notes (Signed)
[facility] Emergency Department Provider Note  ____________________________________________   I have reviewed the triage vital signs and the nursing notes.   HISTORY  Chief Complaint Abdominal Pain   History limited by: Not Limited   HPI Alice Andrews is a 37 y.o. female who presents to the emergency department today because of concerns for right flank pain.  Patient states pain is been present for the past 2 weeks.  Has been somewhat constant.  It is worse with certain movements and deep breaths.  She describes it as severe.  It is located in the right flank with some radiation into her groin.  She states reminds her of previous kidney injury's.  She states that she is required 2 stents in her kidneys because of blockages.  She is unclear what the blockage was due to.  She denies kidney stones.  She does not feel like this is infection.  She has had urinary tract infection in the past and denies any current dysuria, bad odor to her smell or change in frequency.  She denies any fevers.  She is unsure if she has had her gallbladder removed although she has had some abdominal surgery.    Per medical record review patient has a history of kidney stent placed.   Past Medical History:  Diagnosis Date  . Renal disorder     There are no active problems to display for this patient.   Past Surgical History:  Procedure Laterality Date  . CESAREAN SECTION    . KIDNEY SURGERY     stent placed and then removed.     Prior to Admission medications   Medication Sig Start Date End Date Taking? Authorizing Provider  cyclobenzaprine (FLEXERIL) 10 MG tablet Take 1 tablet (10 mg total) by mouth at bedtime. 03/29/15   Christella Scheuermann, PA-C  HYDROcodone-acetaminophen (NORCO/VICODIN) 5-325 MG tablet Take 1 tablet by mouth every 6 (six) hours as needed for moderate pain. 01/04/17   Little, Traci M, PA-C  ibuprofen (ADVIL,MOTRIN) 800 MG tablet Take 800 mg by mouth every  8 (eight) hours as needed. 11/04/14   [provider]  naproxen (NAPROSYN) 500 MG tablet Take 1 tablet (500 mg total) by mouth 2 (two) times daily with a meal. 03/29/15   Christella Scheuermann, PA-C  Prenatal Vit-Fe Fumarate-FA (PRENATAL MULTIVITAMIN) TABS tablet Take 1 tablet by mouth daily at 12 noon.    [provider]    Allergies Patient has no known allergies.  History reviewed. No pertinent family history.  Social History Social History   Tobacco Use  . Smoking status: Former Games developer  . Smokeless tobacco: Never Used  Substance Use Topics  . Alcohol use: Yes    Comment: sometimes  . Drug use: Never    Review of Systems Constitutional: No fever/chills Eyes: No visual changes. ENT: No sore throat. Cardiovascular: Denies chest pain. Respiratory: Denies shortness of breath. Gastrointestinal: Positive for right flank pain. Genitourinary: Negative for dysuria. Musculoskeletal: Negative for back pain. Skin: Negative for rash. Neurological: Negative for headaches, focal weakness or numbness.  ____________________________________________   PHYSICAL EXAM:  VITAL SIGNS: ED Triage Vitals  Enc Vitals Group     BP 02/03/18 1846 111/74     Pulse Rate 02/03/18 1846 91     Resp 02/03/18 1846 18     Temp 02/03/18 1846 98.4 F (36.9 C)     Temp Source 02/03/18 1846 Oral     SpO2 02/03/18 1846 100 %  Weight 02/03/18 1847 170 lb (77.1 kg)     Height 02/03/18 1847 5\' 2"  (1.575 m)     Head Circumference --      Peak Flow --      Pain Score 02/03/18 1846 10   Constitutional: Alert and oriented.  Eyes: Conjunctivae are normal.  ENT      Head: Normocephalic and atraumatic.      Nose: No congestion/rhinnorhea.      Mouth/Throat: Mucous membranes are moist.      Neck: No stridor. Hematological/Lymphatic/Immunilogical: No cervical lymphadenopathy. Cardiovascular: Normal rate, regular rhythm.  No murmurs, rubs, or gallops.  Respiratory: Normal respiratory effort  without tachypnea nor retractions. Breath sounds are clear and equal bilaterally. No wheezes/rales/rhonchi. Gastrointestinal: Soft and tender to palpation in the right upper quadrant.  Genitourinary: Deferred Musculoskeletal: Normal range of motion in all extremities. No lower extremity edema. Neurologic:  Normal speech and language. No gross focal neurologic deficits are appreciated.  Skin:  Skin is warm, dry and intact. No rash noted. Psychiatric: Mood and affect are normal. Speech and behavior are normal. Patient exhibits appropriate insight and judgment.  ____________________________________________    LABS (pertinent positives/negatives)  Upreg negative Lipase 26 CBC wnl CMP wnl UA clear, protein 30, leukocytes moderate, wbc 11-20, bacteria many  ____________________________________________   EKG  None  ____________________________________________    RADIOLOGY  [place] pending   ____________________________________________   PROCEDURES  Procedures  ____________________________________________   INITIAL IMPRESSION / ASSESSMENT AND PLAN / ED COURSE  Pertinent labs & imaging results that were available during my care of the patient were reviewed by me and considered in my medical decision making (see chart for details).   Patient presented to the emergency department today because of concerns for right flank pain.  Differential would be broad including pyelonephritis, kidney stones, biliary disease.  Patient is unsure if she has had her gallbladder removed however per CT scan in 2015 it appears she did have it removed.  Patient also states that she is required kidney stents in the past.  Patient's urine does have findings consistent with infection raising possibility of pyelonephritis.  Will have her check ultrasound of the kidneys and right upper quadrant to evaluate for obstruction and biliary stone.   ____________________________________________   FINAL CLINICAL  IMPRESSION(S) / ED DIAGNOSES  Right flank pain Urinary tract infection  Note: This dictation was prepared with Dragon dictation. Any transcriptional errors that result from this process are unintentional     Phineas Semen, MD 02/04/18 1755

## 2018-02-03 NOTE — ED Triage Notes (Signed)
Pt presents with long-standing abdominal/flank pain. She states that she has had kidney issues in the past and that she has intermittent very strong pain. Pt states it has been worse in the last month; and she also has nausea and some weakness. Pt denies urinary symptoms including frequency, dysuria. Pt alert & oriented with NAD noted.

## 2018-02-03 NOTE — ED Provider Notes (Signed)
-----------------------------------------   11:29 PM on 02/03/2018 -----------------------------------------   Assuming care from Dr. Derrill Kay.  In short, Levina Talbert Nan is a 37 y.o. female with a chief complaint of flank pain.  Refer to the original H&P for additional details.  The current plan of care is to follow up ultrasounds of RUQ and kidneys.  Anticipate discharge assuming no emergent findings on ultrasounds.    ----------------------------------------- 2:10 AM on 02/04/2018 -----------------------------------------  Ultrasounds unremarkable and reassuring.  Patient has been resting comfortably.  Provided the ultrasound results, explained the suspicion that the pain is either MSK or from UTI as per Dr. Asencion Partridge discussion.  Checked [place] Controlled Substance Database, only one prescription in the last two years (low risk).  Will provide Keflex and Norco, encourage close outpatient follow up.  Gave usual/customary return precautions.  Patient agrees with the plan.   Loleta Rose, MD 02/04/18 [phone]

## 2018-02-04 MED ORDER — SODIUM CHLORIDE 0.9 % IV SOLN
INTRAVENOUS | Status: AC
  Administered 2018-02-04: 1 g via INTRAVENOUS
  Filled 2018-02-04: qty 10

## 2018-02-04 MED ORDER — KETOROLAC TROMETHAMINE 30 MG/ML IJ SOLN
15.0000 mg | Freq: Once | INTRAMUSCULAR | Status: AC
  Administered 2018-02-04: 15 mg via INTRAVENOUS
  Filled 2018-02-04: qty 1

## 2018-02-04 MED ORDER — CEPHALEXIN 500 MG PO CAPS: 500.0000 mg | ORAL_CAPSULE | Freq: Three times a day (TID) | ORAL | 0 refills | Status: DC

## 2018-02-04 MED ORDER — HYDROCODONE-ACETAMINOPHEN 5-325 MG PO TABS: 1.0000 | ORAL_TABLET | ORAL | 0 refills | Status: DC | PRN

## 2018-02-04 MED ORDER — OXYCODONE-ACETAMINOPHEN 5-325 MG PO TABS
2.0000 | ORAL_TABLET | Freq: Once | ORAL | Status: AC
  Administered 2018-02-04: 2 via ORAL
  Filled 2018-02-04: qty 2

## 2018-02-04 MED ORDER — DOCUSATE SODIUM 100 MG PO CAPS: ORAL_CAPSULE | ORAL | 0 refills | Status: DC

## 2018-02-04 MED ORDER — CEPHALEXIN 500 MG PO CAPS: 500.0000 mg | ORAL_CAPSULE | Freq: Two times a day (BID) | ORAL | 0 refills | Status: DC

## 2018-02-04 NOTE — Discharge Instructions (Signed)
Your workup in the Emergency Department today was reassuring.  We did not find any specific abnormalities other than a probable urinary tract infection.  We recommend you drink plenty of fluids, take your regular medications and/or any new ones prescribed today, and follow up with the doctor(s) listed in these documents as recommended.  Return to the Emergency Department if you develop new or worsening symptoms that concern you.

## 2019-05-18 ENCOUNTER — Other Ambulatory Visit: Payer: Self-pay | Admitting: Gerontology

## 2019-05-18 DIAGNOSIS — Z1231 Encounter for screening mammogram for malignant neoplasm of breast: Secondary | ICD-10-CM

## 2019-06-04 ENCOUNTER — Other Ambulatory Visit: Payer: Self-pay | Admitting: Gerontology

## 2019-06-04 DIAGNOSIS — Z0000 Encounter for general adult medical examination without abnormal findings: Secondary | ICD-10-CM

## 2019-06-04 DIAGNOSIS — M2550 Pain in unspecified joint: Secondary | ICD-10-CM

## 2019-06-04 DIAGNOSIS — Z7189 Other specified counseling: Secondary | ICD-10-CM

## 2019-06-04 DIAGNOSIS — Z7689 Persons encountering health services in other specified circumstances: Secondary | ICD-10-CM

## 2019-06-04 DIAGNOSIS — G43009 Migraine without aura, not intractable, without status migrainosus: Secondary | ICD-10-CM

## 2019-07-06 ENCOUNTER — Ambulatory Visit: Payer: Self-pay | Attending: Internal Medicine | Admitting: Physical Therapy

## 2019-07-13 ENCOUNTER — Ambulatory Visit: Payer: Self-pay | Admitting: Physical Therapy

## 2019-07-21 ENCOUNTER — Encounter: Payer: PRIVATE HEALTH INSURANCE | Admitting: Physical Therapy

## 2019-07-27 ENCOUNTER — Encounter: Payer: PRIVATE HEALTH INSURANCE | Admitting: Physical Therapy

## 2019-08-03 ENCOUNTER — Encounter: Payer: PRIVATE HEALTH INSURANCE | Admitting: Physical Therapy

## 2019-08-10 ENCOUNTER — Encounter: Payer: PRIVATE HEALTH INSURANCE | Admitting: Physical Therapy

## 2019-08-17 ENCOUNTER — Encounter: Payer: PRIVATE HEALTH INSURANCE | Admitting: Physical Therapy

## 2019-08-20 ENCOUNTER — Other Ambulatory Visit: Payer: Self-pay

## 2019-08-20 ENCOUNTER — Emergency Department: Payer: Medicaid Other

## 2019-08-20 ENCOUNTER — Emergency Department
Admission: EM | Admit: 2019-08-20 | Discharge: 2019-08-20 | Disposition: A | Discharge status: Home / Self Care | Payer: Medicaid Other | Attending: Emergency Medicine | Admitting: Emergency Medicine

## 2019-08-20 DIAGNOSIS — N939 Abnormal uterine and vaginal bleeding, unspecified: Secondary | ICD-10-CM

## 2019-08-20 DIAGNOSIS — O209 Hemorrhage in early pregnancy, unspecified: Secondary | ICD-10-CM

## 2019-08-20 DIAGNOSIS — Z3A01 Less than 8 weeks gestation of pregnancy: Secondary | ICD-10-CM | POA: Diagnosis not present

## 2019-08-20 DIAGNOSIS — Z87891 Personal history of nicotine dependence: Secondary | ICD-10-CM | POA: Diagnosis not present

## 2019-08-20 DIAGNOSIS — Z79899 Other long term (current) drug therapy: Secondary | ICD-10-CM | POA: Diagnosis not present

## 2019-08-20 LAB — BASIC METABOLIC PANEL
Anion gap: 11 (ref 5–15)
BUN: 7 mg/dL (ref 6–20)
CO2: 18 mmol/L — ABNORMAL LOW (ref 22–32)
Calcium: 9.1 mg/dL (ref 8.9–10.3)
Chloride: 103 mmol/L (ref 98–111)
Creatinine, Ser: 0.71 mg/dL (ref 0.44–1.00)
GFR calc Af Amer: >60 mL/min (ref >60)
GFR calc non Af Amer: >60 mL/min (ref >60)
Glucose, Bld: 160 mg/dL — ABNORMAL HIGH (ref 70–99)
Potassium: 3.3 mmol/L — ABNORMAL LOW (ref 3.5–5.1)
Sodium: 132 mmol/L — ABNORMAL LOW (ref 135–145)

## 2019-08-20 LAB — CBC
HCT: 38.7 % (ref 36.0–46.0)
Hemoglobin: 13.4 g/dL (ref 12.0–15.0)
MCH: 33.3 pg (ref 26.0–34.0)
MCHC: 34.6 g/dL (ref 30.0–36.0)
MCV: 96 fL (ref 80.0–100.0)
Platelets: 313 10*3/uL (ref 150–400)
RBC: 4.03 MIL/uL (ref 3.87–5.11)
RDW: 11.6 % (ref 11.5–15.5)
WBC: 6.8 10*3/uL (ref 4.0–10.5)
nRBC: 0 % (ref 0.0–0.2)

## 2019-08-20 LAB — TYPE AND SCREEN
ABO/RH(D): O POS
Antibody Screen: NEGATIVE

## 2019-08-20 LAB — HCG, QUANTITATIVE, PREGNANCY: hCG, Beta Chain, Quant, S: 38700 m[IU]/mL — ABNORMAL HIGH (ref <5)

## 2019-08-20 MED ORDER — TRAMADOL HCL 50 MG PO TABS
50.0000 mg | ORAL_TABLET | Freq: Once | ORAL | Status: AC
  Administered 2019-08-20: 50 mg via ORAL
  Filled 2019-08-20: qty 1

## 2019-08-20 MED ORDER — ACETAMINOPHEN 500 MG PO TABS: 1000.0000 mg | ORAL_TABLET | Freq: Once | ORAL | Status: DC

## 2019-08-20 NOTE — ED Notes (Signed)
PT GIVEN A PAD AND TO WATCH FOR FEVER AND TO CALL HER OBGYN IN THE MORNING.

## 2019-08-20 NOTE — ED Triage Notes (Signed)
Pt here with vaginal bleeding assuming to be a miscarriage. Pt is 5 weeks today and was having severe pain/cramps earlier today. Pt also reports a large amount of blood. Pt in NAD.

## 2019-08-20 NOTE — Discharge Instructions (Addendum)
You were seen today for vaginal bleeding and pregnancy.  Your ultrasound shows a nonviable pregnancy, an early gestational sac with subchorionic hemorrhage.  This will likely lead to miscarriage.  Please follow-up with your OB/GYN on Monday for repeat hCG levels and pelvic/transvaginal ultrasound if needed.

## 2019-08-20 NOTE — ED Notes (Signed)
See triage note. Pt in d/t severe abd pain lasting 2hrs this morning. Denies major nausea. Reports regular BM and urination. Reports noting blood this morning so came to ER.

## 2019-08-20 NOTE — ED Provider Notes (Signed)
[facility] Emergency Department Provider Note ____________________________________________  Time seen: 1945  I have reviewed the triage vital signs and the nursing notes.  HISTORY  Chief Complaint  Miscarriage   HPI Alice Andrews is a 39 y.o. female presents to the ER today with complaint of abdominal cramping and vaginal bleeding.  She reports about 2-1/2-hour ago, she developed sudden onset abdominal cramping and low back pain.  She reports this went on for about 2-1/2 hours and then she noticed a small amount of vaginal bleeding.  She does not think she has passed any clots.  She reports she is approximately [redacted] weeks pregnant.  Her last menstrual period was July 18, 2019.  She has had 2+ home pregnancy test and a positive pregnancy test at her OBs office.  She is G6 P3-2-0-4.  Past Medical History:  Diagnosis Date  . Renal disorder     There are no problems to display for this patient.   Past Surgical History:  Procedure Laterality Date  . CESAREAN SECTION    . KIDNEY SURGERY     stent placed and then removed.     Prior to Admission medications   Medication Sig Start Date End Date Taking? Authorizing Provider  cephALEXin (KEFLEX) 500 MG capsule Take 1 capsule (500 mg total) by mouth 3 (three) times daily. 02/04/18   Loleta Rose, MD  cyclobenzaprine (FLEXERIL) 10 MG tablet Take 1 tablet (10 mg total) by mouth at bedtime. 03/29/15   Christella Scheuermann, PA-C  docusate sodium (COLACE) 100 MG capsule Take 1 tablet once or twice daily as needed for constipation while taking narcotic pain medicine 02/04/18   Loleta Rose, MD  HYDROcodone-acetaminophen (NORCO/VICODIN) 5-325 MG tablet Take 1-2 tablets by mouth every 4 (four) hours as needed for moderate pain. 02/04/18   Loleta Rose, MD  ibuprofen (ADVIL,MOTRIN) 800 MG tablet Take 800 mg by mouth every 8 (eight) hours as needed. 11/04/14   [provider]  naproxen (NAPROSYN) 500 MG tablet Take  1 tablet (500 mg total) by mouth 2 (two) times daily with a meal. 03/29/15   Christella Scheuermann, PA-C  Prenatal Vit-Fe Fumarate-FA (PRENATAL MULTIVITAMIN) TABS tablet Take 1 tablet by mouth daily at 12 noon.    [provider]    Allergies Patient has no known allergies.  No family history on file.  Social History Social History   Tobacco Use  . Smoking status: Former Games developer  . Smokeless tobacco: Never Used  Substance Use Topics  . Alcohol use: Yes    Comment: sometimes  . Drug use: Never    Review of Systems  Constitutional: Negative for fever, chills or body aches. Cardiovascular: Negative for chest pain or chest tightness. Respiratory: Negative for cough or shortness of breath. Gastrointestinal: Positive for abdominal cramping.  Negative for nausea ,vomiting and diarrhea. Genitourinary: Positive for vaginal bleeding. Negative for urinary urgency, dysuria, blood in her urine or vaginal discharge. ____________________________________________  PHYSICAL EXAM:  VITAL SIGNS: ED Triage Vitals  Enc Vitals Group     BP 08/20/19 1823 123/75     Pulse Rate 08/20/19 1823 (!) 119     Resp 08/20/19 1823 18     Temp 08/20/19 1823 98.9 F (37.2 C)     Temp Source 08/20/19 1823 Oral     SpO2 08/20/19 1823 100 %     Weight 08/20/19 1824 183 lb (83 kg)     Height 08/20/19 1824 5' (1.524 m)     Head  Circumference --      Peak Flow --      Pain Score 08/20/19 1823 10     Pain Loc --      Pain Edu? --      Excl. in [place]? --     Constitutional: Alert and oriented. Well appearing and in no distress. Cardiovascular: Tachycardic, regular rhythm.  Respiratory: Normal respiratory effort. No wheezes/rales/rhonchi. Gastrointestinal: Soft and nontender. No distention. Unable to palpate uterus. Neurologic:  Normal speech and language. No gross focal neurologic deficits are appreciated. Psychiatric: Tearful. Patient exhibits appropriate insight and  judgment. ____________________________________________    LABS Labs Reviewed  BASIC METABOLIC PANEL - Abnormal; Notable for the following components:      Result Value   Sodium 132 (*)    Potassium 3.3 (*)    CO2 18 (*)    Glucose, Bld 160 (*)    All other components within normal limits  HCG, QUANTITATIVE, PREGNANCY - Abnormal; Notable for the following components:   hCG, Beta Chain, Quant, S 38,700 (*)    All other components within normal limits  CBC  TYPE AND SCREEN    ____________________________________________  RADIOLOGY  Imaging Orders     US OB LESS THAN 14 WEEKS WITH OB TRANSVAGINAL  ____________________________________________   INITIAL IMPRESSION / ASSESSMENT AND PLAN / ED COURSE  Vaginal Bleeding in Pregnancy:  CBC, CMET, serum Hcg, type and screen Will obtain ultrasound- subchorionic hemorrage Discussed findings with pt, questions answered Tramadol 50 mg PO x 1 for cramping Pt will follow up with OB as an outpatient    _______________________  FINAL CLINICAL IMPRESSION(S) / ED DIAGNOSES  Final diagnoses:  Vaginal bleeding affecting early pregnancy      Jearld Fenton, NP 08/20/19 2153    Duffy Bruce, MD 08/21/19 2048

## 2019-08-24 ENCOUNTER — Encounter: Payer: PRIVATE HEALTH INSURANCE | Admitting: Physical Therapy

## 2019-08-31 ENCOUNTER — Encounter: Payer: PRIVATE HEALTH INSURANCE | Admitting: Physical Therapy

## 2020-01-14 ENCOUNTER — Other Ambulatory Visit: Payer: Self-pay | Admitting: Orthopedic Surgery

## 2020-01-14 DIAGNOSIS — G8929 Other chronic pain: Secondary | ICD-10-CM

## 2020-01-14 DIAGNOSIS — M2391 Unspecified internal derangement of right knee: Secondary | ICD-10-CM

## 2020-01-14 DIAGNOSIS — M25561 Pain in right knee: Secondary | ICD-10-CM

## 2020-01-14 DIAGNOSIS — M25562 Pain in left knee: Secondary | ICD-10-CM

## 2020-01-14 DIAGNOSIS — M222X2 Patellofemoral disorders, left knee: Secondary | ICD-10-CM

## 2020-01-14 DIAGNOSIS — M222X1 Patellofemoral disorders, right knee: Secondary | ICD-10-CM

## 2020-01-14 DIAGNOSIS — M2392 Unspecified internal derangement of left knee: Secondary | ICD-10-CM

## 2020-10-27 ENCOUNTER — Other Ambulatory Visit: Payer: Self-pay | Admitting: Orthopedic Surgery

## 2020-10-27 DIAGNOSIS — M25562 Pain in left knee: Secondary | ICD-10-CM

## 2020-10-27 DIAGNOSIS — G8929 Other chronic pain: Secondary | ICD-10-CM

## 2020-11-04 ENCOUNTER — Ambulatory Visit: Payer: Self-pay | Admitting: *Deleted

## 2020-11-04 NOTE — Telephone Encounter (Signed)
Pt just tested positive for covid. Pt has slight congestion. And is wanting advise on what she can do.   Attempted to call patient- left message to call back on VM

## 2020-11-04 NOTE — Telephone Encounter (Signed)
Patient calling to report her family has tested + COVID- home test. They were exposed at birthday celebration on Saturday. Children have improved - but patient symptoms are just starting. Advised per COVID protocol- treatment, isolation and emergency care- UC/ED. All questions answered and patient to follow up with PCP if needed.  Reason for Disposition  [1] COVID-19 diagnosed by positive lab test (e.g., PCR, rapid self-test kit) AND [2] mild symptoms (e.g., cough, fever, others) AND [5] no complications or SOB  Answer Assessment - Initial Assessment Questions 1. COVID-19 DIAGNOSIS: "Who made your COVID-19 diagnosis?" "Was it confirmed by a positive lab test or self-test?" If not diagnosed by a doctor (or NP/PA), ask "Are there lots of cases (community spread) where you live?" Note: See public health department website, if unsure.     + COVID home test 2. COVID-19 EXPOSURE: "Was there any known exposure to COVID before the symptoms began?" CDC Definition of close contact: within 6 feet (2 meters) for a total of 15 minutes or more over a 24-hour period.      yes- birthday celebration on Saturday 3. ONSET: "When did the COVID-19 symptoms start?"      yesterday 4. WORST SYMPTOM: "What is your worst symptom?" (e.g., cough, fever, shortness of breath, muscle aches)     Cough, fatigue 5. COUGH: "Do you have a cough?" If Yes, ask: "How bad is the cough?"       Yes- not bad 6. FEVER: "Do you have a fever?" If Yes, ask: "What is your temperature, how was it measured, and when did it start?"     no 7. RESPIRATORY STATUS: "Describe your breathing?" (e.g., shortness of breath, wheezing, unable to speak)      Breathing normally 8. BETTER-SAME-WORSE: "Are you getting better, staying the same or getting worse compared to yesterday?"  If getting worse, ask, "In what way?"     Same- symptoms started yesterday- still developing 9. HIGH RISK DISEASE: "Do you have any chronic medical problems?" (e.g., asthma,  heart or lung disease, weak immune system, obesity, etc.)     no 10. VACCINE: "Have you had the COVID-19 vaccine?" If Yes, ask: "Which one, how many shots, when did you get it?"       no 11. BOOSTER: "Have you received your COVID-19 booster?" If Yes, ask: "Which one and when did you get it?"       no 12. PREGNANCY: "Is there any chance you are pregnant?" "When was your last menstrual period?"       N/a 13. OTHER SYMPTOMS: "Do you have any other symptoms?"  (e.g., chills, fatigue, headache, loss of smell or taste, muscle pain, sore throat)       Muscle ache 14. O2 SATURATION MONITOR:  "Do you use an oxygen saturation monitor (pulse oximeter) at home?" If Yes, ask "What is your reading (oxygen level) today?" "What is your usual oxygen saturation reading?" (e.g., 95%)       no  Protocols used: Coronavirus (COVID-19) Diagnosed or Suspected-A-AH

## 2020-11-29 ENCOUNTER — Other Ambulatory Visit: Payer: Self-pay

## 2020-11-29 ENCOUNTER — Encounter: Payer: Self-pay | Admitting: Student in an Organized Health Care Education/Training Program

## 2020-11-29 ENCOUNTER — Ambulatory Visit
Payer: Medicaid Other | Attending: Student in an Organized Health Care Education/Training Program | Admitting: Student in an Organized Health Care Education/Training Program

## 2020-11-29 VITALS — BP 121/64 | HR 82 | Temp 97.0°F | Resp 16 | Ht 60.0 in | Wt 173.0 lb

## 2020-11-29 DIAGNOSIS — M25561 Pain in right knee: Secondary | ICD-10-CM | POA: Diagnosis not present

## 2020-11-29 DIAGNOSIS — M1712 Unilateral primary osteoarthritis, left knee: Secondary | ICD-10-CM | POA: Insufficient documentation

## 2020-11-29 DIAGNOSIS — M170 Bilateral primary osteoarthritis of knee: Secondary | ICD-10-CM | POA: Insufficient documentation

## 2020-11-29 DIAGNOSIS — M1711 Unilateral primary osteoarthritis, right knee: Secondary | ICD-10-CM | POA: Diagnosis not present

## 2020-11-29 DIAGNOSIS — G8929 Other chronic pain: Secondary | ICD-10-CM | POA: Diagnosis present

## 2020-11-29 DIAGNOSIS — M25562 Pain in left knee: Secondary | ICD-10-CM | POA: Diagnosis present

## 2020-11-29 NOTE — Patient Instructions (Signed)
____________________________________________________________________________________________  General Risks and Possible Complications  Patient Responsibilities: It is important that you read this as it is part of your informed consent. It is our duty to inform you of the risks and possible complications associated with treatments offered to you. It is your responsibility as a patient to read this and to ask questions about anything that is not clear or that you believe was not covered in this document.  Patient's Rights: You have the right to refuse treatment. You also have the right to change your mind, even after initially having agreed to have the treatment done. However, under this last option, if you wait until the last second to change your mind, you may be charged for the materials used up to that point.  Introduction: Medicine is not an exact science. Everything in Medicine, including the lack of treatment(s), carries the potential for danger, harm, or loss (which is by definition: Risk). In Medicine, a complication is a secondary problem, condition, or disease that can aggravate an already existing one. All treatments carry the risk of possible complications. The fact that a side effects or complications occurs, does not imply that the treatment was conducted incorrectly. It must be clearly understood that these can happen even when everything is done following the highest safety standards.  No treatment: You can choose not to proceed with the proposed treatment alternative. The "PRO(s)" would include: avoiding the risk of complications associated with the therapy. The "CON(s)" would include: not getting any of the treatment benefits. These benefits fall under one of three categories: diagnostic; therapeutic; and/or palliative. Diagnostic benefits include: getting information which can ultimately lead to improvement of the disease or symptom(s). Therapeutic benefits are those associated with the  successful treatment of the disease. Finally, palliative benefits are those related to the decrease of the primary symptoms, without necessarily curing the condition (example: decreasing the pain from a flare-up of a chronic condition, such as incurable terminal cancer).  General Risks and Complications: These are associated to most interventional treatments. They can occur alone, or in combination. They fall under one of the following six (6) categories: no benefit or worsening of symptoms; bleeding; infection; nerve damage; allergic reactions; and/or death. No benefits or worsening of symptoms: In Medicine there are no guarantees, only probabilities. No healthcare provider can ever guarantee that a medical treatment will work, they can only state the probability that it may. Furthermore, there is always the possibility that the condition may worsen, either directly, or indirectly, as a consequence of the treatment. Bleeding: This is more common if the patient is taking a blood thinner, either prescription or over the counter (example: Goody Powders, Fish oil, Aspirin, Garlic, etc.), or if suffering a condition associated with impaired coagulation (example: Hemophilia, cirrhosis of the liver, low platelet counts, etc.). However, even if you do not have one on these, it can still happen. If you have any of these conditions, or take one of these drugs, make sure to notify your treating physician. Infection: This is more common in patients with a compromised immune system, either due to disease (example: diabetes, cancer, human immunodeficiency virus [HIV], etc.), or due to medications or treatments (example: therapies used to treat cancer and rheumatological diseases). However, even if you do not have one on these, it can still happen. If you have any of these conditions, or take one of these drugs, make sure to notify your treating physician. Nerve Damage: This is more common when the treatment is an invasive    one, but it can also happen with the use of medications, such as those used in the treatment of cancer. The damage can occur to small secondary nerves, or to large primary ones, such as those in the spinal cord and brain. This damage may be temporary or permanent and it may lead to impairments that can range from temporary numbness to permanent paralysis and/or brain death. Allergic Reactions: Any time a substance or material comes in contact with our body, there is the possibility of an allergic reaction. These can range from a mild skin rash (contact dermatitis) to a severe systemic reaction (anaphylactic reaction), which can result in death. Death: In general, any medical intervention can result in death, most of the time due to an unforeseen complication. ____________________________________________________________________________________________ ______________________________________________________________________  Preparing for Procedure with Sedation  NOTICE: Due to recent regulatory changes, starting on November 07, 2020, procedures requiring intravenous (IV) sedation will no longer be performed at the [facility].  These types of procedures are required to be performed at [facility] ambulatory surgery facility.  We are very sorry for the inconvenience.  Procedure appointments are limited to planned procedures: No Prescription Refills. No disability issues will be discussed. No medication changes will be discussed.  Instructions: Oral Intake: Do not eat or drink anything for at least 8 hours prior to your procedure. (Exception: Blood Pressure Medication. See below.) Transportation: A driver is required. You may not drive yourself after the procedure. Blood Pressure Medicine: Do not forget to take your blood pressure medicine with a sip of water the morning of the procedure. If your Diastolic (lower reading) is above 100 mmHg, elective cases will be cancelled/rescheduled. Blood thinners:  These will need to be stopped for procedures. Notify our staff if you are taking any blood thinners. Depending on which one you take, there will be specific instructions on how and when to stop it. Diabetics on insulin: Notify the staff so that you can be scheduled 1st case in the morning. If your diabetes requires high dose insulin, take only  of your normal insulin dose the morning of the procedure and notify the staff that you have done so. Preventing infections: Shower with an antibacterial soap the morning of your procedure. Build-up your immune system: Take 1000 mg of Vitamin C with every meal (3 times a day) the day prior to your procedure. Antibiotics: Inform the staff if you have a condition or reason that requires you to take antibiotics before dental procedures. Pregnancy: If you are pregnant, call and cancel the procedure. Sickness: If you have a cold, fever, or any active infections, call and cancel the procedure. Arrival: You must be in the facility at least 30 minutes prior to your scheduled procedure. Children: Do not bring children with you. Dress appropriately: Bring dark clothing that you would not mind if they get stained. Valuables: Do not bring any jewelry or valuables.  Reasons to call and reschedule or cancel your procedure: (Following these recommendations will minimize the risk of a serious complication.) Surgeries: Avoid having procedures within 2 weeks of any surgery. (Avoid for 2 weeks before or after any surgery). Flu Shots: Avoid having procedures within 2 weeks of a flu shots. (Avoid for 2 weeks before or after immunizations). Barium: Avoid having a procedure within 7-10 days after having had a radiological study involving the use of radiological contrast. (Myelograms, Barium swallow or enema study). Heart attacks: Avoid any elective procedures or surgeries for the initial 6 months after a "Myocardial Infarction" (Heart  Attack). Blood thinners: It is imperative that  you stop these medications before procedures. Let us know if you if you take any blood thinner.  Infection: Avoid procedures during or within two weeks of an infection (including chest colds or gastrointestinal problems). Symptoms associated with infections include: Localized redness, fever, chills, night sweats or profuse sweating, burning sensation when voiding, cough, congestion, stuffiness, runny nose, sore throat, diarrhea, nausea, vomiting, cold or Flu symptoms, recent or current infections. It is specially important if the infection is over the area that we intend to treat. Heart and lung problems: Symptoms that may suggest an active cardiopulmonary problem include: cough, chest pain, breathing difficulties or shortness of breath, dizziness, ankle swelling, uncontrolled high or unusually low blood pressure, and/or palpitations. If you are experiencing any of these symptoms, cancel your procedure and contact your primary care physician for an evaluation.  Remember:  Regular Business hours are:  Monday to Thursday 8:00 AM to 4:00 PM  Provider's Schedule: Delano Metz, MD:  Procedure days: Tuesday and Thursday 7:30 AM to 4:00 PM  Edward Jolly, MD:  Procedure days: Monday and Wednesday 7:30 AM to 4:00 PM ______________________________________________________________________  ____________________________________________________________________________________________  Genicular Nerve Block  What is a genicular nerve block? A genicular nerve block is the injection of a local anesthetic to block the nerves that transmits pain from the knee.  What is the purpose of a facet nerve block? A genicular nerve block is a diagnostic procedure to determine if the pathologic changes (i.e. arthritis, meniscal tears, etc) and inflammation within the knee joint is the source of your knee pain. It also confirms that the knee pain will respond well to the actual treatment procedure. If a genicular nerve  block works, it will give you relief for several hours. After that, the pain is expected to return to normal. This test is always performed twice (usually a week or two apart) because two successful tests are required to move onto treatment. If both diagnostic tests are positive, then we schedule a treatment called radiofrequency (RF) ablation. In this procedure, the same nerves are cauterized, which typically leads to pain relief for 4 -18 months. If this process works well for one knee, it can be performed on the other knee if needed.  How is the procedure performed? You will be placed on the procedure table. The injection site is sterilized with either iodine or chlorhexadine. The site to be injected is numbed with a local anesthetic, and a needle is directed to the target area. X-ray guidance is used to ensure proper placement and positioning of the needle. When the needle is properly positioned near the genicular nerve, local anesthetic is injected to numb that nerve. This will be repeated at multiple sites around the knee to block all genicular nerves.  Will the procedure be painful? The injection can be painful and we therefore provide the option of receiving IV sedation. IV sedation, combined with local anesthetic, can make the injection nearly pain free. It allows you to remain very still during the procedure, which can also make the injection easier, faster, and more successful. If you decide to have IV sedation, you must have a driver to get you home safely afterwards. In addition, you cannot have anything to eat or drink within 8 hours of your appointment (clear liquids are allowed until 3 hours before the procedure). If you take medications for diabetes, these medications may need to be adjusted the morning of the procedure. Your primary care physician can help  you with this adjustment.  What are the discharge instructions? If you received IV sedation do not drive or operate machinery for at  least 24 hours after the procedure. You may return to work the next day following your procedure. You may resume your normal diet immediately. Do not engage in any strenuous activity for 24 hours. You should, however, engage in moderate activity that typically causes your ususal pain. If the block works, those activities should not be painful for several hours after the injection. Do not take a bath, swim, or use a hot tub for 24 hours (you may take a shower). Call the office if you have any of the following: severe pain afterwards (different than your usual symptoms), redness/swelling/discharge at the injection site(s), fevers/chills, difficulty with bowel or bladder functions.  What are the risks and side effects? The complication rate for this procedure is very low. Whenever a needle enters the skin, bleeding or infection can occur. Some other serious but extremely rare risks include paralysis and death. You may have an allergic reaction to any of the medications used. If you have a known allergy to any medications, especially local anesthetics, notify our staff before the procedure takes place. You may experience any of the following side effects up to 4 - 6 hours after the procedure: Leg muscle weakness or numbness may occur due to the local anesthetic affecting the nerves that control your legs (this is a temporary affect and it is not paralysis). If you have any leg weakness or numbness, walk only with assistance in order to prevent falls and injury. Your leg strength will return slowly and completely. Dizziness may occur due to a decrease in your blood pressure. If this occurs, remain in a seated or lying position. Gradually sit up, and then stand after at least 10 minutes of sitting. Mild headaches may occur. Drink fluids and take pain medications if needed. If the headaches persist or become severe, call the office. Mild discomfort at the injection site can occur. This typically lasts for a few  hours but can persist for a couple days. If this occurs, take anti-inflammatories or pain medications, apply ice to the area the day of the procedure. If it persists, apply moist heat in the day(s) following.  The side effects listed above can be normal. They are not dangerous and will resolve on their own. If, however, you experience any of the following, a complication may have occurred and you should either contact your doctor. If he is not readily available, then you should proceed to the closest urgent care center for evaluation: Severe or progressive pain at the injection site(s) Arm or leg weakness that progressively worsens or persists for longer than 8 hours Severe or progressive redness, swelling, or discharge from the injections site(s) Fevers, chills, nausea, or vomiting Bowel or bladder dysfunction (i.e. inability to urinate or pass stool or difficulty controlling either)  How long does it take for the procedure to work? You should feel relief from your usual pain within the first hour. Again, this is only expected to last for several hours, at the most. Remember, you may be sore in the middle part of your back from the needles, and you must distinguish this from your usual pain. ____________________________________________________________________________________________
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
## 2020-11-29 NOTE — Progress Notes (Signed)
Safety precautions to be maintained throughout the outpatient stay will include: orient to surroundings, keep bed in low position, maintain call bell within reach at all times, provide assistance with transfer out of bed and ambulation.  

## 2020-12-13 ENCOUNTER — Telehealth: Payer: Self-pay

## 2020-12-13 NOTE — Telephone Encounter (Signed)
Insurance denied auth for genicular nerve block.

## 2021-01-30 ENCOUNTER — Ambulatory Visit
Payer: Medicaid Other | Attending: Student in an Organized Health Care Education/Training Program | Admitting: Student in an Organized Health Care Education/Training Program

## 2021-01-30 ENCOUNTER — Other Ambulatory Visit: Payer: Self-pay

## 2021-01-30 ENCOUNTER — Encounter: Payer: Self-pay | Admitting: Student in an Organized Health Care Education/Training Program

## 2021-01-30 DIAGNOSIS — M1711 Unilateral primary osteoarthritis, right knee: Secondary | ICD-10-CM | POA: Diagnosis not present

## 2021-01-30 DIAGNOSIS — M25561 Pain in right knee: Secondary | ICD-10-CM

## 2021-01-30 DIAGNOSIS — M25562 Pain in left knee: Secondary | ICD-10-CM

## 2021-01-30 DIAGNOSIS — M1712 Unilateral primary osteoarthritis, left knee: Secondary | ICD-10-CM | POA: Diagnosis not present

## 2021-01-30 DIAGNOSIS — G8929 Other chronic pain: Secondary | ICD-10-CM

## 2021-01-30 DIAGNOSIS — M170 Bilateral primary osteoarthritis of knee: Secondary | ICD-10-CM

## 2021-01-30 NOTE — Progress Notes (Signed)
Patient: Alice Andrews  Service Category: E/M  Provider: Gillis Santa, MD  DOB: 04/02/1981  DOS: 01/30/2021  Location: Office  MRN: [id number]  Setting: Ambulatory outpatient  Referring Provider: [place]  Type: Established Patient  Specialty: Interventional Pain Management  PCP: [place]  Location: Home  Delivery: TeleHealth     Virtual Encounter - Pain Management PROVIDER NOTE: Information contained herein reflects review and annotations entered in association with encounter. Interpretation of such information and data should be left to medically-trained personnel. Information provided to patient can be located elsewhere in the medical record under "Patient Instructions". Document created using STT-dictation technology, any transcriptional errors that may result from process are unintentional.    Contact & Pharmacy Preferred: [phone] Home: [phone] (home) Mobile: [phone] (mobile) E-mail: No e-mail address on record  [id number] [place] [id number]: [phone]: [phone] [id number] - [place], [address]one: [phone]: [phone] Pre-screening  [name] offered "in-person" vs "virtual" encounter. She indicated preferring virtual for this encounter.   Reason COVID-19*  Social distancing based on CDC and AMA recommendations.   I contacted Alice Andrews 01/30/2021 via telephone.      I clearly identified myself as BiGillis SantaMD. I verified that I was speaking with the correct person using two identifiers (Name: JoCAMBRIE SONNENFELDand date of birth: [id number]  Consent I sought verbal advanced consent from Alice Ministeror virtual visit interactions. I informed Ms. BlPhilipp Ovensf possible security and privacy concerns, risks, and limitations associated with providing  "not-in-person" medical evaluation and management services. I also informed Ms. BlPhilipp Ovensf the availability of "in-person" appointments. Finally, I informed her that there would be a charge for the virtual visit and that she could be  personally, fully or partially, financially responsible for it. Ms. BlPhilipp Ovensxpressed understanding and agreed to proceed.   Historic Elements   Alice Andrews a [age].o. year old, female patient evaluated today after our last contact on 11/29/2020. Ms. BlPhilipp Ovenshas a past medical history of Renal disorder. She also  has a past surgical history that includes Cesarean section and Kidney surgery. Ms. BlPhilipp Ovensas a current medication list which includes the following prescription(s): acetaminophen. She  reports that she has quit smoking. She has never used smokeless tobacco. She reports current alcohol use. She reports that she does not use drugs. Ms. BlPhilipp Ovensas No Known Allergies.   HPI  Today, she is being contacted for worsening of previously known (established) problem  Significant bilateral knee pain and some weakness Medicaid denied genicular nerve block  Discussed alternative therapies including long-acting intra-articular knee steroid injection, viscosupplementation with Monovisc.  Risks and benefits reviewed for each 1.  Patient would like to start with long-acting intra-articular knee steroid injection.    Laboratory Chemistry Profile   Renal Lab Results  Component Value Date   BUN 7 08/20/2019   CREATININE 0.71 08/20/2019   GFRAA >60 08/20/2019   GFRNONAA >60 08/20/2019    Hepatic Lab Results  Component Value Date   AST 17 02/03/2018   ALT 14 02/03/2018   ALBUMIN 4.4 02/03/2018   ALKPHOS 76 02/03/2018   LIPASE 26 02/03/2018    Electrolytes Lab Results  Component Value Date   NA 132 (L) 08/20/2019   K 3.3 (  L) 08/20/2019   CL 103 08/20/2019   CALCIUM 9.1 08/20/2019    Bone No results found for: VD25OH, [id number],  CW2376EG3, TD1761YW7, 25OHVITD1, 25OHVITD2, 25OHVITD3, TESTOFREE, TESTOSTERONE  Inflammation (CRP: Acute Phase) (ESR: Chronic Phase) No results found for: CRP, ESRSEDRATE, LATICACIDVEN       Note: Above Lab results reviewed.  Imaging  US OB LESS THAN 14 WEEKS WITH OB TRANSVAGINAL CLINICAL DATA:  Vaginal bleeding for 1 day, beta HCG 38,700  EXAM: OBSTETRIC <14 WK [place] AND TRANSVAGINAL OB US  TECHNIQUE: Both transabdominal and transvaginal ultrasound examinations were performed for complete evaluation of the gestation as well as the maternal uterus, adnexal regions, and pelvic cul-de-sac. Transvaginal technique was performed to assess early pregnancy.  COMPARISON:  None.  FINDINGS: Intrauterine gestational sac: Single  Yolk sac:  Not Visualized.  Embryo:  Not Visualized.  Maternal uterus/adnexae: There is a probable small intrauterine gestational sac identified measuring approximately 10 mm. However, there is significant heterogeneous fluid surrounding the presumed early gestational sac, as well as heterogeneous endometrial thickening, consistent with large subchorionic hemorrhage and blood products.  The left ovary measures 4.5 x 2.9 x 3.5 cm. The right ovary is not visualized. There is a small amount of free fluid within the pelvis. There are no adnexal masses.  IMPRESSION: 1. No documented live intrauterine pregnancy at this time. There is a probable early gestational sac identified within the endometrium, with significant surrounding subchorionic hemorrhage and blood products. Findings are suspicious but not yet definitive for failed pregnancy. Recommend follow-up US in 10-14 days for definitive diagnosis. This recommendation follows SRU consensus guidelines: Diagnostic Criteria for Nonviable Pregnancy Early in the First Trimester. [place] Med 2013; 371:0626-94. 2. Small amount of free fluid, with no evidence of pelvic mass. No sonographic evidence of ectopic  pregnancy. 3. Normal left ovary.  Right ovary not identified.  Electronically Signed   By: Randa Ngo M.D.   On: 08/20/2019 21:41  Assessment  The primary encounter diagnosis was Bilateral primary osteoarthritis of knee. Diagnoses of Primary osteoarthritis of right knee, Primary osteoarthritis of left knee, and Chronic pain of both knees were also pertinent to this visit.  Plan of Care    Orders:  Orders Placed This Encounter  Procedures   KNEE INJECTION    Local Anesthetic & Steroid injection.    Standing Status:   Future    Standing Expiration Date:   05/02/2021    Scheduling Instructions:     Side: Bilateral     Sedation: None     Timeframe: As soon as schedule allows    Order Specific Question:   Where will this procedure be performed?    Answer:   [facility]    Follow-up plan:   Return in about 2 days (around 02/01/2021) for B/L Knee steroid.    Recent Visits Date Type Provider Dept  11/29/20 Office Visit Gillis Santa, MD [facility]  Showing recent visits within past 90 days and meeting all other requirements Today's Visits Date Type Provider Dept  01/30/21 Office Visit Gillis Santa, MD [facility]  Showing today's visits and meeting all other requirements Future Appointments No visits were found meeting these conditions. Showing future appointments within next 90 days and meeting all other requirements I discussed the assessment and treatment plan with the patient. The patient was provided an opportunity to ask questions and all were answered. The patient agreed with the plan and demonstrated an understanding of the instructions.  Patient advised  to call back or seek an in-person evaluation if the symptoms or condition worsens.  Duration of encounter: 20 minutes.  Note by: Gillis Santa, MD Date: 01/30/2021; Time: 1:16 PM

## 2021-02-01 ENCOUNTER — Other Ambulatory Visit: Payer: Self-pay

## 2021-02-01 ENCOUNTER — Ambulatory Visit
Payer: Medicaid Other | Attending: Student in an Organized Health Care Education/Training Program | Admitting: Student in an Organized Health Care Education/Training Program

## 2021-02-01 ENCOUNTER — Encounter: Payer: Self-pay | Admitting: Student in an Organized Health Care Education/Training Program

## 2021-02-01 DIAGNOSIS — M170 Bilateral primary osteoarthritis of knee: Secondary | ICD-10-CM | POA: Insufficient documentation

## 2021-02-01 DIAGNOSIS — M25562 Pain in left knee: Secondary | ICD-10-CM | POA: Insufficient documentation

## 2021-02-01 DIAGNOSIS — M25561 Pain in right knee: Secondary | ICD-10-CM | POA: Diagnosis not present

## 2021-02-01 DIAGNOSIS — G8929 Other chronic pain: Secondary | ICD-10-CM | POA: Insufficient documentation

## 2021-02-01 DIAGNOSIS — M1711 Unilateral primary osteoarthritis, right knee: Secondary | ICD-10-CM | POA: Diagnosis not present

## 2021-02-01 DIAGNOSIS — M1712 Unilateral primary osteoarthritis, left knee: Secondary | ICD-10-CM | POA: Insufficient documentation

## 2021-02-01 MED ORDER — LIDOCAINE HCL 2 % IJ SOLN
20.0000 mL | Freq: Once | INTRAMUSCULAR | Status: AC
  Administered 2021-02-01: 400 mg

## 2021-02-01 MED ORDER — TRIAMCINOLONE ACETONIDE 32 MG IX SRER
32.0000 mg | Freq: Once | INTRA_ARTICULAR | Status: AC
  Administered 2021-02-01: 32 mg via INTRA_ARTICULAR
  Filled 2021-02-01: qty 5

## 2021-02-01 MED ORDER — LIDOCAINE HCL 2 % IJ SOLN
INTRAMUSCULAR | Status: AC
  Filled 2021-02-01: qty 20

## 2021-02-01 NOTE — Patient Instructions (Signed)

## 2021-02-01 NOTE — Progress Notes (Signed)
Safety precautions to be maintained throughout the outpatient stay will include: orient to surroundings, keep bed in low position, maintain call bell within reach at all times, provide assistance with transfer out of bed and ambulation.  
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
## 2021-02-02 ENCOUNTER — Telehealth: Payer: Self-pay

## 2021-02-02 NOTE — Telephone Encounter (Signed)
Post procedure phone call.  LM 

## 2021-02-13 ENCOUNTER — Ambulatory Visit: Payer: Medicaid Other | Admitting: Student in an Organized Health Care Education/Training Program

## 2021-02-15 ENCOUNTER — Ambulatory Visit: Payer: Medicaid Other | Admitting: Student in an Organized Health Care Education/Training Program

## 2021-02-23 ENCOUNTER — Ambulatory Visit
Payer: Medicaid Other | Attending: Student in an Organized Health Care Education/Training Program | Admitting: Student in an Organized Health Care Education/Training Program

## 2021-02-23 ENCOUNTER — Encounter: Payer: Self-pay | Admitting: Student in an Organized Health Care Education/Training Program

## 2021-02-23 ENCOUNTER — Other Ambulatory Visit: Payer: Self-pay

## 2021-02-23 DIAGNOSIS — G8929 Other chronic pain: Secondary | ICD-10-CM

## 2021-02-23 DIAGNOSIS — M25561 Pain in right knee: Secondary | ICD-10-CM

## 2021-02-23 DIAGNOSIS — M1712 Unilateral primary osteoarthritis, left knee: Secondary | ICD-10-CM | POA: Diagnosis not present

## 2021-02-23 DIAGNOSIS — M1711 Unilateral primary osteoarthritis, right knee: Secondary | ICD-10-CM | POA: Diagnosis not present

## 2021-02-23 DIAGNOSIS — M170 Bilateral primary osteoarthritis of knee: Secondary | ICD-10-CM | POA: Diagnosis not present

## 2021-02-23 MED ORDER — DICLOFENAC SODIUM 75 MG PO TBEC: 75.0000 mg | DELAYED_RELEASE_TABLET | Freq: Two times a day (BID) | ORAL | 0 refills | Status: AC | PRN

## 2021-02-23 NOTE — Progress Notes (Signed)
Patient: Alice Andrews  Service Category: E/M  Provider: Gillis Santa, MD  DOB: [birth year]  DOS: 02/23/2021  Location: Office  MRN: [id number]  Setting: Ambulatory outpatient  Referring Provider: Kerrick  Type: Established Patient  Specialty: Interventional Pain Management  PCP: [place]  Location: Remote location  Delivery: TeleHealth     Virtual Encounter - Pain Management PROVIDER NOTE: Information contained herein reflects review and annotations entered in association with encounter. Interpretation of such information and data should be left to medically-trained personnel. Information provided to patient can be located elsewhere in the medical record under "Patient Instructions". Document created using STT-dictation technology, any transcriptional errors that may result from process are unintentional.    Contact & Pharmacy Preferred: [phone] Home: [phone] (home) Mobile: [phone] (mobile) E-mail: No e-mail address on record  [id number] [place] [id number]: [phone]: [phone] [id number] [place]: [phone]: [phone] Pre-screening  [name] offered "in-person" vs "virtual" encounter. She indicated preferring virtual for this encounter.   Reason COVID-19*  Social distancing based on CDC and AMA recommendations.   I contacted [name] 02/23/2021 via telephone.      I clearly identified myself as BiGillis SantaMD. I verified that I was speaking with the correct person using two identifiers (Name: Alice DWORKINand date of birth: 1105/31/1982  Consent I sought verbal advanced consent from JoStephan Ministeror virtual visit interactions. I informed Ms. BlPhilipp Ovensf possible security and privacy concerns, risks, and limitations associated with  providing "not-in-person" medical evaluation and management services. I also informed Ms. BlPhilipp Ovensf the availability of "in-person" appointments. Finally, I informed her that there would be a charge for the virtual visit and that she could be  personally, fully or partially, financially responsible for it. Ms. BlPhilipp Ovensxpressed understanding and agreed to proceed.   Historic Elements   Alice Andrews a [age].o. year old, female patient evaluated today after our last contact on 02/01/2021. Ms. BlPhilipp Ovenshas a past medical history of Renal disorder. She also  has a past surgical history that includes Cesarean section and Kidney surgery. Ms. BlPhilipp Ovensas a current medication list which includes the following prescription(s): acetaminophen and diclofenac. She  reports that she has quit smoking. She has never used smokeless tobacco. She reports current alcohol use. She reports that she does not use drugs. Ms. BlPhilipp Ovensas No Known Allergies.   HPI  Today, she is being contacted for worsening of previously known (established) problem  Worsening bilateral knee pain, has failed PT and IA steroid injection therapy I tried to do a P2P for a patient earlier this week for viscosupplementation which they denied States that she is in the process getting BCBS and thinks she'll have better success with gel therapy      Laboratory Chemistry Profile   Renal Lab Results  Component Value Date   BUN 7 08/20/2019   CREATININE 0.71 08/20/2019   GFRAA >60 08/20/2019   GFRNONAA >60 08/20/2019    Hepatic Lab Results  Component Value Date   AST 17 02/03/2018   ALT 14 02/03/2018   ALBUMIN 4.4 02/03/2018   ALKPHOS 76 02/03/2018   LIPASE 26 02/03/2018    Electrolytes Lab Results  Component Value Date   NA  132 (L) 08/20/2019   K 3.3 (L) 08/20/2019   CL 103 08/20/2019   CALCIUM 9.1 08/20/2019    Bone No results found for: VD25OH, VD125OH2TOT, XV4008QP6, PP5093OI7, 25OHVITD1, 25OHVITD2,  25OHVITD3, TESTOFREE, TESTOSTERONE  Inflammation (CRP: Acute Phase) (ESR: Chronic Phase) No results found for: CRP, ESRSEDRATE, LATICACIDVEN       Note: Above Lab results reviewed.  Imaging  US OB LESS THAN 14 WEEKS WITH OB TRANSVAGINAL CLINICAL DATA:  Vaginal bleeding for 1 day, beta HCG 38,700  EXAM: OBSTETRIC <14 WK [place] AND TRANSVAGINAL OB US  TECHNIQUE: Both transabdominal and transvaginal ultrasound examinations were performed for complete evaluation of the gestation as well as the maternal uterus, adnexal regions, and pelvic cul-de-sac. Transvaginal technique was performed to assess early pregnancy.  COMPARISON:  None.  FINDINGS: Intrauterine gestational sac: Single  Yolk sac:  Not Visualized.  Embryo:  Not Visualized.  Maternal uterus/adnexae: There is a probable small intrauterine gestational sac identified measuring approximately 10 mm. However, there is significant heterogeneous fluid surrounding the presumed early gestational sac, as well as heterogeneous endometrial thickening, consistent with large subchorionic hemorrhage and blood products.  The left ovary measures 4.5 x 2.9 x 3.5 cm. The right ovary is not visualized. There is a small amount of free fluid within the pelvis. There are no adnexal masses.  IMPRESSION: 1. No documented live intrauterine pregnancy at this time. There is a probable early gestational sac identified within the endometrium, with significant surrounding subchorionic hemorrhage and blood products. Findings are suspicious but not yet definitive for failed pregnancy. Recommend follow-up US in 10-14 days for definitive diagnosis. This recommendation follows SRU consensus guidelines: Diagnostic Criteria for Nonviable Pregnancy Early in the First Trimester. [place] Med 2013; 124:5809-98. 2. Small amount of free fluid, with no evidence of pelvic mass. No sonographic evidence of ectopic pregnancy. 3. Normal left ovary.  Right ovary not  identified.  Electronically Signed   By: Randa Ngo M.D.   On: 08/20/2019 21:41  Assessment  The primary encounter diagnosis was Bilateral primary osteoarthritis of knee. Diagnoses of Primary osteoarthritis of right knee, Primary osteoarthritis of left knee, and Chronic pain of both knees were also pertinent to this visit.  Plan of Care   Once patient has established with [facility], she is aware to contact [employer] to seek approval for viscosupplementation as next line of therapy.  This was denied by Medicaid.  In the interim, recommend diclofenac as below for knee pain related to knee osteoarthritis.  Pharmacotherapy (Medications Ordered): Meds ordered this encounter  Medications   diclofenac (VOLTAREN) 75 MG EC tablet    Sig: Take 1 tablet (75 mg total) by mouth 2 (two) times daily as needed.    Dispense:  60 tablet    Refill:  0     Follow-up plan:   Return for Patient will call with updated insurance to schedule knee gel injections.    Recent Visits Date Type Provider Dept  02/01/21 Procedure visit Gillis Santa, MD [facility]  01/30/21 Office Visit Gillis Santa, MD [facility]  11/29/20 Office Visit Gillis Santa, MD [facility]  Showing recent visits within past 90 days and meeting all other requirements Today's Visits Date Type Provider Dept  02/23/21 Office Visit Gillis Santa, MD [facility]  Showing today's visits and meeting all other requirements Future Appointments No visits were found meeting these conditions. Showing future appointments within next 90 days and meeting all other requirements I discussed  the assessment and treatment plan with the patient. The patient was provided an opportunity to ask questions and all were answered. The patient agreed with the plan and demonstrated an understanding of the instructions.  Patient advised to call back or seek an in-person evaluation if the symptoms or  condition worsens.  Duration of encounter: 20 minutes.  Note by: Gillis Santa, MD Date: 02/23/2021; Time: 10:55 AM

## 2021-03-08 ENCOUNTER — Telehealth: Payer: Self-pay

## 2021-03-08 NOTE — Telephone Encounter (Signed)
I scheduled bilateral hyalgan knee injections for Monday 12/5. Didn't know if you needed to order the medicine or get from pharmacy.

## 2021-03-08 NOTE — Telephone Encounter (Signed)
No we don't . Appreciate you checking.

## 2021-03-13 ENCOUNTER — Other Ambulatory Visit: Payer: Self-pay

## 2021-03-13 ENCOUNTER — Encounter: Payer: Self-pay | Admitting: Student in an Organized Health Care Education/Training Program

## 2021-03-13 ENCOUNTER — Ambulatory Visit
Payer: BC Managed Care – PPO | Attending: Student in an Organized Health Care Education/Training Program | Admitting: Student in an Organized Health Care Education/Training Program

## 2021-03-13 VITALS — BP 114/70 | HR 100 | Temp 98.2°F | Resp 15 | Ht 60.0 in | Wt 180.0 lb

## 2021-03-13 DIAGNOSIS — M1711 Unilateral primary osteoarthritis, right knee: Secondary | ICD-10-CM | POA: Insufficient documentation

## 2021-03-13 DIAGNOSIS — M25562 Pain in left knee: Secondary | ICD-10-CM | POA: Diagnosis present

## 2021-03-13 DIAGNOSIS — G8929 Other chronic pain: Secondary | ICD-10-CM | POA: Diagnosis present

## 2021-03-13 DIAGNOSIS — M25561 Pain in right knee: Secondary | ICD-10-CM | POA: Diagnosis present

## 2021-03-13 DIAGNOSIS — M170 Bilateral primary osteoarthritis of knee: Secondary | ICD-10-CM | POA: Diagnosis present

## 2021-03-13 DIAGNOSIS — M1712 Unilateral primary osteoarthritis, left knee: Secondary | ICD-10-CM | POA: Insufficient documentation

## 2021-03-13 MED ORDER — LIDOCAINE HCL 2 % IJ SOLN
20.0000 mL | Freq: Once | INTRAMUSCULAR | Status: AC
  Administered 2021-03-13: 400 mg
  Filled 2021-03-13: qty 20

## 2021-03-13 MED ORDER — HYALURONAN 88 MG/4ML IX SOSY
4.0000 mL | PREFILLED_SYRINGE | Freq: Once | INTRA_ARTICULAR | Status: DC
  Filled 2021-03-13: qty 4
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
## 2021-03-13 NOTE — Progress Notes (Signed)
Safety precautions to be maintained throughout the outpatient stay will include: orient to surroundings, keep bed in low position, maintain call bell within reach at all times, provide assistance with transfer out of bed and ambulation.  

## 2021-03-13 NOTE — Patient Instructions (Signed)

## 2021-03-14 ENCOUNTER — Telehealth: Payer: Self-pay

## 2021-03-14 NOTE — Telephone Encounter (Signed)
Called PP No complaints at this time. Instructed to call if needed.

## 2021-03-22 ENCOUNTER — Other Ambulatory Visit: Payer: Self-pay | Admitting: Student in an Organized Health Care Education/Training Program

## 2021-04-11 ENCOUNTER — Telehealth: Payer: Self-pay

## 2021-04-11 NOTE — Telephone Encounter (Signed)
Attempted to call patient for PP eval on 04/12/21. No answer, Left message to call our office.

## 2021-04-12 ENCOUNTER — Other Ambulatory Visit: Payer: Self-pay

## 2021-04-12 ENCOUNTER — Encounter: Payer: Self-pay | Admitting: Student in an Organized Health Care Education/Training Program

## 2021-04-12 ENCOUNTER — Ambulatory Visit
Payer: BC Managed Care – PPO | Attending: Student in an Organized Health Care Education/Training Program | Admitting: Student in an Organized Health Care Education/Training Program

## 2021-04-12 DIAGNOSIS — M1711 Unilateral primary osteoarthritis, right knee: Secondary | ICD-10-CM | POA: Diagnosis not present

## 2021-04-12 DIAGNOSIS — M25562 Pain in left knee: Secondary | ICD-10-CM

## 2021-04-12 DIAGNOSIS — G8929 Other chronic pain: Secondary | ICD-10-CM

## 2021-04-12 DIAGNOSIS — M170 Bilateral primary osteoarthritis of knee: Secondary | ICD-10-CM | POA: Diagnosis not present

## 2021-04-12 DIAGNOSIS — M1712 Unilateral primary osteoarthritis, left knee: Secondary | ICD-10-CM

## 2021-04-12 DIAGNOSIS — M25561 Pain in right knee: Secondary | ICD-10-CM

## 2021-04-12 NOTE — Progress Notes (Signed)
Patient: Alice Andrews  Service Category: E/M  Provider: Gillis Santa, MD  DOB: 1980-11-22  DOS: 04/12/2021  Location: Office  MRN: [id number]  Setting: Ambulatory outpatient  Referring Provider: [place]  Type: Established Patient  Specialty: Interventional Pain Management  PCP: [place]  Location: Home  Delivery: TeleHealth     Virtual Encounter - Pain Management PROVIDER NOTE: Information contained herein reflects review and annotations entered in association with encounter. Interpretation of such information and data should be left to medically-trained personnel. Information provided to patient can be located elsewhere in the medical record under "Patient Instructions". Document created using STT-dictation technology, any transcriptional errors that may result from process are unintentional.    Contact & Pharmacy Preferred: [phone] Home: [phone] (home) Mobile: [phone] (mobile) E-mail: No e-mail address on record  [id number] [place] [address] [place]: [phone]: [phone] [id number] - [place], [address] [place]: [phone] 2671ax: [phone] Pre-screening  Alice Andrews offered "in-person" vs "virtual" encounter. She indicated preferring virtual for this encounter.   Reason COVID-19*   Social distancing based on CDC and AMA recommendations.   I contacted Alice Andrews on 04/12/2021 via telephone.      I clearly identified myself as BiGillis SantaMD. I verified that I was speaking with the correct person using two identifiers (Name: Alice Andrews, and date of birth: 1110-29-1982  Consent I sought verbal advanced consent from JoBreathittor virtual visit interactions. I informed Alice Andrews of possible security and privacy concerns, risks, and limitations associated with providing  "not-in-person" medical evaluation and management services. I also informed Alice Andrews of the availability of "in-person" appointments. Finally, I informed her that there would be a charge for the virtual visit and that she could be  personally, fully or partially, financially responsible for it. Alice Andrews expressed understanding and agreed to proceed.   Historic Elements   Alice Andrews is a [age].o. year old, female patient evaluated today after our last contact on 03/22/2021. Alice Andrews a past medical history of Renal disorder. She also  has a past surgical history that includes Cesarean section and Kidney surgery. Alice Andrews a current medication list which includes the following prescription(s): acetaminophen. She  reports that she has been smoking cigarettes. She has never used smokeless tobacco. She reports current alcohol use. She reports that she does not use drugs. Alice Andrews has No Known Allergies.   HPI  Today, she is being contacted for a post-procedure assessment.   Post-procedure evaluation    Monovisc Intra-articular Knee Injection  No.: 1   Series: n/a Level/approach: Medial Laterality: Bilateral (-50) Imaging guidance: None required ([id number]: Local anesthesia Sedation: None.   Purpose: Diagnostic/Therapeutic Indications: Knee arthralgia associated to osteoarthritis of the knee  NAS-11 score:   Pre-procedure: 8 /10   Post-procedure: 8 /10  1. Bilateral primary osteoarthritis of knee   2. Primary osteoarthritis of right knee   3. Primary osteoarthritis of left knee   4. Chronic pain of both knees       Effectiveness:  Initial hour after procedure: 50 %  Subsequent 4-6 hours post-procedure: 50 %  Analgesia past initial 6 hours: 45 % (lasting 3 days)  Ongoing improvement:  Analgesic:  0% Function: No benefit ROM: No benefit  Laboratory Chemistry Profile   Renal Lab Results  Component Value Date   BUN 7  08/20/2019   CREATININE 0.71 08/20/2019   GFRAA >60 08/20/2019   GFRNONAA >60 08/20/2019    Hepatic Lab Results  Component Value Date   AST 17 02/03/2018   ALT 14 02/03/2018   ALBUMIN 4.4 02/03/2018   ALKPHOS 76 02/03/2018   LIPASE 26 02/03/2018    Electrolytes Lab Results  Component Value Date   NA 132 (L) 08/20/2019   K 3.3 (L) 08/20/2019   CL 103 08/20/2019   CALCIUM 9.1 08/20/2019    Bone No results found for: VD25OH, VD125OH2TOT, SE8315VV6, HY0737TG6, 25OHVITD1, 25OHVITD2, 25OHVITD3, TESTOFREE, TESTOSTERONE  Inflammation (CRP: Acute Phase) (ESR: Chronic Phase) No results found for: CRP, ESRSEDRATE, LATICACIDVEN       Note: Above Lab results reviewed.  Imaging  US OB LESS THAN 14 WEEKS WITH OB TRANSVAGINAL CLINICAL DATA:  Vaginal bleeding for 1 day, beta HCG 38,700  EXAM: OBSTETRIC <14 WK [place] AND TRANSVAGINAL OB US  TECHNIQUE: Both transabdominal and transvaginal ultrasound examinations were performed for complete evaluation of the gestation as well as the maternal uterus, adnexal regions, and pelvic cul-de-sac. Transvaginal technique was performed to assess early pregnancy.  COMPARISON:  None.  FINDINGS: Intrauterine gestational sac: Single  Yolk sac:  Not Visualized.  Embryo:  Not Visualized.  Maternal uterus/adnexae: There is a probable small intrauterine gestational sac identified measuring approximately 10 mm. However, there is significant heterogeneous fluid surrounding the presumed early gestational sac, as well as heterogeneous endometrial thickening, consistent with large subchorionic hemorrhage and blood products.  The left ovary measures 4.5 x 2.9 x 3.5 cm. The right ovary is not visualized. There is a small amount of free fluid within the pelvis. There are no adnexal masses.  IMPRESSION: 1. No documented live intrauterine pregnancy at this time. There is a probable early gestational sac identified within the endometrium, with significant  surrounding subchorionic hemorrhage and blood products. Findings are suspicious but not yet definitive for failed pregnancy. Recommend follow-up US in 10-14 days for definitive diagnosis. This recommendation follows SRU consensus guidelines: Diagnostic Criteria for Nonviable Pregnancy Early in the First Trimester. [place] Med 2013; 269:4854-62. 2. Small amount of free fluid, with no evidence of pelvic mass. No sonographic evidence of ectopic pregnancy. 3. Normal left ovary.  Right ovary not identified.  Electronically Signed   By: Randa Ngo M.D.   On: 08/20/2019 21:41  Assessment  The primary encounter diagnosis was Bilateral primary osteoarthritis of knee. Diagnoses of Primary osteoarthritis of right knee, Primary osteoarthritis of left knee, and Chronic pain of both knees were also pertinent to this visit.  Plan of Care  Unfortunately no benefit previous intra-articular knee steroid or Monovisc injections.  Review of bilateral knee MRI from 2021 shows significant chondral loss consistent with severe osteoarthritis.  I did offer the patient a diagnostic genicular nerve block and possible genicular radiofrequency ablation however she is not interested that.  She is more concerned about her knee giving out and buckling.  I recommend that she follow-up with orthopedics to discuss surgical options given persistent and worsening bilateral knee pain and instability.  Follow-up plan:   No follow-ups on file.    Recent Visits Date Type Provider Dept  03/13/21 Procedure visit Gillis Santa, MD [facility]  02/23/21 Office Visit Gillis Santa, MD [facility]  02/01/21 Procedure visit Gillis Santa, MD [facility]  01/30/21 Office Visit Gillis Santa, MD [facility]  Showing recent visits within past 90 days and meeting all other requirements Today's Visits Date Type Provider Dept  04/12/21 Office Visit Gillis Santa, MD [facility]   Showing today's visits and meeting all other requirements Future Appointments No visits were found meeting these conditions. Showing future appointments within next 90 days and meeting all other requirements  I discussed the assessment and treatment plan with the patient. The patient was provided an opportunity to ask questions and all were answered. The patient agreed with the plan and demonstrated an understanding of the instructions.  Patient advised to call back or seek an in-person evaluation if the symptoms or condition worsens.  Duration of encounter: 18mnutes.  Note by: BGillis Santa MD Date: 04/12/2021; Time: 3:29 PM

## 2021-04-26 ENCOUNTER — Other Ambulatory Visit: Payer: Self-pay | Admitting: Orthopedic Surgery

## 2021-04-26 ENCOUNTER — Telehealth: Payer: Self-pay

## 2021-04-26 DIAGNOSIS — M1711 Unilateral primary osteoarthritis, right knee: Secondary | ICD-10-CM

## 2021-04-26 DIAGNOSIS — M170 Bilateral primary osteoarthritis of knee: Secondary | ICD-10-CM

## 2021-04-26 DIAGNOSIS — G8929 Other chronic pain: Secondary | ICD-10-CM

## 2021-04-26 DIAGNOSIS — M1712 Unilateral primary osteoarthritis, left knee: Secondary | ICD-10-CM

## 2021-04-26 DIAGNOSIS — M25562 Pain in left knee: Secondary | ICD-10-CM

## 2021-04-26 NOTE — Telephone Encounter (Signed)
The patient called requesting nerve blocks in both knees. There is no order. If you want her to have them please put in order or advise if you want to see her first.

## 2021-05-02 ENCOUNTER — Telehealth: Payer: Self-pay

## 2021-05-02 NOTE — Telephone Encounter (Signed)
Her insurance denied the genicular, they state it is investigational and her insurance will not pay for it. No peer to peer was offered. I left the patient a vm letting her know.

## 2021-05-03 ENCOUNTER — Ambulatory Visit: Admission: RE | Admit: 2021-05-03 | Payer: BC Managed Care – PPO | Source: Ambulatory Visit

## 2021-05-17 ENCOUNTER — Other Ambulatory Visit: Payer: Self-pay | Admitting: Gerontology

## 2021-05-17 DIAGNOSIS — Z1231 Encounter for screening mammogram for malignant neoplasm of breast: Secondary | ICD-10-CM

## 2021-05-18 ENCOUNTER — Other Ambulatory Visit: Payer: Self-pay

## 2021-05-18 ENCOUNTER — Ambulatory Visit
Admission: RE | Admit: 2021-05-18 | Discharge: 2021-05-18 | Disposition: A | Discharge status: Home / Self Care | Payer: BC Managed Care – PPO | Source: Ambulatory Visit | Attending: Orthopedic Surgery | Admitting: Orthopedic Surgery

## 2021-05-18 ENCOUNTER — Ambulatory Visit: Payer: BC Managed Care – PPO

## 2021-05-18 DIAGNOSIS — G8929 Other chronic pain: Secondary | ICD-10-CM | POA: Diagnosis present

## 2021-05-18 DIAGNOSIS — M25562 Pain in left knee: Secondary | ICD-10-CM | POA: Insufficient documentation

## 2021-05-22 ENCOUNTER — Other Ambulatory Visit: Payer: Self-pay | Admitting: *Deleted

## 2021-05-22 DIAGNOSIS — N390 Urinary tract infection, site not specified: Secondary | ICD-10-CM

## 2021-05-23 ENCOUNTER — Other Ambulatory Visit
Admission: RE | Admit: 2021-05-23 | Discharge: 2021-05-23 | Disposition: A | Discharge status: Home / Self Care | Payer: BC Managed Care – PPO | Attending: Urology | Admitting: Urology

## 2021-05-23 ENCOUNTER — Ambulatory Visit (INDEPENDENT_AMBULATORY_CARE_PROVIDER_SITE_OTHER): Payer: BC Managed Care – PPO | Admitting: Urology

## 2021-05-23 ENCOUNTER — Other Ambulatory Visit: Payer: Self-pay

## 2021-05-23 ENCOUNTER — Encounter: Payer: Self-pay | Admitting: Urology

## 2021-05-23 VITALS — BP 128/80 | HR 102 | Ht 60.0 in | Wt 178.0 lb

## 2021-05-23 DIAGNOSIS — N390 Urinary tract infection, site not specified: Secondary | ICD-10-CM | POA: Insufficient documentation

## 2021-05-23 DIAGNOSIS — R109 Unspecified abdominal pain: Secondary | ICD-10-CM

## 2021-05-23 DIAGNOSIS — N958 Other specified menopausal and perimenopausal disorders: Secondary | ICD-10-CM

## 2021-05-23 LAB — URINALYSIS, COMPLETE (UACMP) WITH MICROSCOPIC
Bilirubin Urine: NEGATIVE
Glucose, UA: NEGATIVE mg/dL
Ketones, ur: NEGATIVE mg/dL
Leukocytes,Ua: NEGATIVE
Nitrite: NEGATIVE
Protein, ur: NEGATIVE mg/dL
Specific Gravity, Urine: 1.02 (ref 1.005–1.030)
pH: 7 (ref 5.0–8.0)

## 2021-05-23 LAB — BLADDER SCAN AMB NON-IMAGING

## 2021-05-23 MED ORDER — NITROFURANTOIN MONOHYD MACRO 100 MG PO CAPS: 100.0000 mg | ORAL_CAPSULE | Freq: Every day | ORAL | 0 refills | Status: DC

## 2021-05-23 MED ORDER — ESTRADIOL 0.1 MG/GM VA CREA: TOPICAL_CREAM | VAGINAL | 6 refills | Status: DC

## 2021-05-23 NOTE — Patient Instructions (Addendum)
Urinary Tract Infection, Adult A urinary tract infection (UTI) is an infection of any part of the urinary tract. The urinary tract includes the kidneys, ureters, bladder, and urethra. These organs make, store, and get rid of urine in the body. An upper UTI affects the ureters and kidneys. A lower UTI affects the bladder and urethra. What are the causes? Most urinary tract infections are caused by bacteria in your genital area around your urethra, where urine leaves your body. These bacteria grow and cause inflammation of your urinary tract. What increases the risk? You are more likely to develop this condition if: You have a urinary catheter that stays in place. You are not able to control when you urinate or have a bowel movement (incontinence). You are female and you: Use a spermicide or diaphragm for birth control. Have low estrogen levels. Are pregnant. You have certain genes that increase your risk. You are sexually active. You take antibiotic medicines. You have a condition that causes your flow of urine to slow down, such as: An enlarged prostate, if you are female. Blockage in your urethra. A kidney stone. A nerve condition that affects your bladder control (neurogenic bladder). Not getting enough to drink, or not urinating often. You have certain medical conditions, such as: Diabetes. A weak disease-fighting system (immunesystem). Sickle cell disease. Gout. Spinal cord injury. What are the signs or symptoms? Symptoms of this condition include: Needing to urinate right away (urgency). Frequent urination. This may include small amounts of urine each time you urinate. Pain or burning with urination. Blood in the urine. Urine that smells bad or unusual. Trouble urinating. Cloudy urine. Vaginal discharge, if you are female. Pain in the abdomen or the lower back. You may also have: Vomiting or a decreased appetite. Confusion. Irritability or tiredness. A fever or  chills. Diarrhea. The first symptom in older adults may be confusion. In some cases, they may not have any symptoms until the infection has worsened. How is this diagnosed? This condition is diagnosed based on your medical history and a physical exam. You may also have other tests, including: Urine tests. Blood tests. Tests for STIs (sexually transmitted infections). If you have had more than one UTI, a cystoscopy or imaging studies may be done to determine the cause of the infections. How is this treated? Treatment for this condition includes: Antibiotic medicine. Over-the-counter medicines to treat discomfort. Drinking enough water to stay hydrated. If you have frequent infections or have other conditions such as a kidney stone, you may need to see a health care provider who specializes in the urinary tract (urologist). In rare cases, urinary tract infections can cause sepsis. Sepsis is a life-threatening condition that occurs when the body responds to an infection. Sepsis is treated in the hospital with IV antibiotics, fluids, and other medicines. Follow these instructions at home: Medicines Take over-the-counter and prescription medicines only as told by your health care provider. If you were prescribed an antibiotic medicine, take it as told by your health care provider. Do not stop using the antibiotic even if you start to feel better. General instructions Make sure you: Empty your bladder often and completely. Do not hold urine for long periods of time. Empty your bladder after sex. Wipe from front to back after urinating or having a bowel movement if you are female. Use each tissue only one time when you wipe. Drink enough fluid to keep your urine pale yellow. Keep all follow-up visits. This is important. Contact a health care provider  if: Your symptoms do not get better after 1-2 days. Your symptoms go away and then return. Get help right away if: You have severe pain in your  back or your lower abdomen. You have a fever or chills. You have nausea or vomiting. Summary A urinary tract infection (UTI) is an infection of any part of the urinary tract, which includes the kidneys, ureters, bladder, and urethra. Most urinary tract infections are caused by bacteria in your genital area. Treatment for this condition often includes antibiotic medicines. If you were prescribed an antibiotic medicine, take it as told by your health care provider. Do not stop using the antibiotic even if you start to feel better. Keep all follow-up visits. This is important. This information is not intended to replace advice given to you by your health care provider. Make sure you discuss any questions you have with your health care provider. Document Revised: 11/06/2019 Document Reviewed: 11/06/2019 Elsevier Patient Education  2022 [place].  Cystoscopy Cystoscopy is a procedure that is used to help diagnose and sometimes treat conditions that affect the lower urinary tract. The lower urinary tract includes the bladder and the urethra. The urethra is the tube that drains urine from the bladder. Cystoscopy is done using a thin, tube-shaped instrument with a light and camera at the end (cystoscope). The cystoscope may be hard or flexible, depending on the goal of the procedure. The cystoscope is inserted through the urethra, into the bladder. Cystoscopy may be recommended if you have: Urinary tract infections that keep coming back. Blood in the urine (hematuria). An inability to control when you urinate (urinary incontinence) or an overactive bladder. Unusual cells found in a urine sample. A blockage in the urethra, such as a urinary stone. Painful urination. An abnormality in the bladder found during an intravenous pyelogram (IVP) or CT scan. What are the risks? Generally, this is a safe procedure. However, problems may occur, including: Infection. Bleeding.  What happens during the  procedure?  You will be given one or more of the following: A medicine to numb the area (local anesthetic). The area around the opening of your urethra will be cleaned. The cystoscope will be passed through your urethra into your bladder. Germ-free (sterile) fluid will flow through the cystoscope to fill your bladder. The fluid will stretch your bladder so that your health care provider can clearly examine your bladder walls. Your doctor will look at the urethra and bladder. The cystoscope will be removed The procedure may vary among health care providers  What can I expect after the procedure? After the procedure, it is common to have: Some soreness or pain in your urethra. Urinary symptoms. These include: Mild pain or burning when you urinate. Pain should stop within a few minutes after you urinate. This may last for up to a few days after the procedure. A small amount of blood in your urine for several days. Feeling like you need to urinate but producing only a small amount of urine. Follow these instructions at home: General instructions Return to your normal activities as told by your health care provider.  Drink plenty of fluids after the procedure. Keep all follow-up visits as told by your health care provider. This is important. Contact a health care provider if you: Have pain that gets worse or does not get better with medicine, especially pain when you urinate lasting longer than 72 hours after the procedure. Have trouble urinating. Get help right away if you: Have blood clots in  your urine. Have a fever or chills. Are unable to urinate. Summary Cystoscopy is a procedure that is used to help diagnose and sometimes treat conditions that affect the lower urinary tract. Cystoscopy is done using a thin, tube-shaped instrument with a light and camera at the end. After the procedure, it is common to have some soreness or pain in your urethra. It is normal to have blood in your  urine after the procedure.  If you were prescribed an antibiotic medicine, take it as told by your health care provider.  This information is not intended to replace advice given to you by your health care provider. Make sure you discuss any questions you have with your health care provider. Document Revised: 03/18/2018 Document Reviewed: 03/18/2018 Elsevier Patient Education  [place].

## 2021-05-23 NOTE — Progress Notes (Signed)
05/23/21 12:53 PM   Alice Andrews 07/21/1980 [id number]  CC: Recurrent UTI, right flank pain, pelvic pain  HPI: 41 year old female referred for recurrent UTIs with complex urologic history.  She reportedly had a right ureteral stent placed around the year 2000 during pregnancy, and this was subsequently removed after delivery.  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  She also had a complex surgery at [facility] in May 2021 with gynecology for removal of a molar pregnancy and hysterectomy/bilateral oophorectomy.  She reports there was a bladder injury at that time and she had to have a catheter for at least a week, however on my review of the operative note it reports 1500 mL of blood loss and " bilateral ureterolysis" billed as a procedure, but there is no documentation within the operative note about any work near or on the ureter, or evidence of bladder injury.  She reports symptoms of right-sided flank pain with any type of caffeine intake.  She takes cranberry tablets for prophylaxis.  She reports 4-6 UTIs a year with symptoms of dysuria and pelvic pressure.  She was recently seen by PCP and urinalysis was relatively benign, however culture grew E. coli.  It looks like they called in Cipro 2 days ago, but the patient had not yet been contacted.  She also has mild stress incontinence with sneezing that is very bothersome.   PMH: Past Medical History:  Diagnosis Date   Renal disorder     Surgical History: Past Surgical History:  Procedure Laterality Date   CESAREAN SECTION     KIDNEY SURGERY     stent placed and then removed.      Social History:  reports that she has been smoking cigarettes. She has been exposed to tobacco smoke. She has never used smokeless tobacco. She reports current alcohol use. She reports that she does not use drugs.  Physical Exam: BP 128/80    Pulse (!) 102    Ht 5' (1.524 m)    Wt 178 lb (80.7 kg)    LMP 02/08/2015     BMI 34.76 kg/m    Constitutional:  Alert and oriented, No acute distress. Cardiovascular: No clubbing, cyanosis, or edema. Respiratory: Normal respiratory effort, no increased work of breathing. GI: Abdomen is soft, nontender, nondistended, no abdominal masses   Laboratory Data: Reviewed  Assessment & Plan:   41 year old female with complex history including right ureteral stent placed temporarily in 2000 during pregnancy and subsequently removed after delivery, and most recently extensive work with gynecology at [facility] in May 2021 for hysterectomy with removal of a molar pregnancy and bilateral oophorectomy, with possible bladder injury at that time.  She has a long history of recurrent UTIs, but things seem to have worsened over the last few years.  We discussed the evaluation and treatment of patients with recurrent UTIs at length.  We specifically discussed the differences between asymptomatic bacteriuria and true urinary tract infection.  We discussed the AUA definition of recurrent UTI of at least 2 culture proven symptomatic acute cystitis episodes in a 67-month period, or 3 within a 1 year period.  We discussed the importance of culture directed antibiotic treatment, and antibiotic stewardship.  First-line therapy includes nitrofurantoin(5 days), Bactrim(3 days), or fosfomycin(3 g single dose).  Possible etiologies of recurrent infection include periurethral tissue atrophy in postmenopausal woman, constipation, sexual activity, incomplete emptying, anatomic abnormalities, and even genetic predisposition.  Finally, we discussed the role of perineal hygiene, timed voiding,  adequate hydration, topical vaginal estrogen, cranberry prophylaxis, and low-dose antibiotic prophylaxis.  I recommended cystoscopy and CT urogram for further evaluation of her anatomy with her complex history and reported bladder injury in the past  Recommend starting Kegel exercises for her stress incontinence, consider  referral to pelvic floor physical therapy in the future  Trial of topical estrogen cream as well as 90 days nitrofurantoin prophylaxis for recurrent UTIs, GSM may also be playing a role  I spent 65 total minutes on the day of the encounter including pre-visit review of the medical record, face-to-face time with the patient, and post visit ordering of labs/imaging/tests.  Legrand Rams, MD 05/23/2021  [facility] [address], [address] [place], [place] [address] [phone]

## 2021-06-20 ENCOUNTER — Other Ambulatory Visit: Payer: BC Managed Care – PPO | Admitting: Urology

## 2021-06-27 ENCOUNTER — Other Ambulatory Visit: Payer: BC Managed Care – PPO | Admitting: Urology

## 2021-07-03 ENCOUNTER — Other Ambulatory Visit: Payer: Self-pay | Admitting: Orthopedic Surgery

## 2021-07-03 ENCOUNTER — Encounter: Payer: Self-pay | Admitting: Orthopedic Surgery

## 2021-07-07 ENCOUNTER — Encounter
Admission: RE | Disposition: A | Discharge status: Home / Self Care | Payer: Self-pay | Source: Home / Self Care | Attending: Orthopedic Surgery

## 2021-07-07 ENCOUNTER — Other Ambulatory Visit: Payer: Self-pay

## 2021-07-07 ENCOUNTER — Ambulatory Visit: Payer: BC Managed Care – PPO | Admitting: Anesthesiology

## 2021-07-07 ENCOUNTER — Ambulatory Visit
Admission: RE | Admit: 2021-07-07 | Discharge: 2021-07-07 | Disposition: A | Discharge status: Home / Self Care | Payer: BC Managed Care – PPO | Attending: Orthopedic Surgery | Admitting: Orthopedic Surgery

## 2021-07-07 ENCOUNTER — Encounter: Payer: Self-pay | Admitting: Orthopedic Surgery

## 2021-07-07 DIAGNOSIS — Z87891 Personal history of nicotine dependence: Secondary | ICD-10-CM | POA: Diagnosis not present

## 2021-07-07 DIAGNOSIS — M1712 Unilateral primary osteoarthritis, left knee: Secondary | ICD-10-CM | POA: Diagnosis present

## 2021-07-07 DIAGNOSIS — M948X8 Other specified disorders of cartilage, other site: Secondary | ICD-10-CM | POA: Insufficient documentation

## 2021-07-07 DIAGNOSIS — Z6835 Body mass index (BMI) 35.0-35.9, adult: Secondary | ICD-10-CM | POA: Insufficient documentation

## 2021-07-07 DIAGNOSIS — E669 Obesity, unspecified: Secondary | ICD-10-CM | POA: Diagnosis not present

## 2021-07-07 HISTORY — DX: Deficiency of other specified B group vitamins: E53.8

## 2021-07-07 HISTORY — DX: Vitamin D deficiency, unspecified: E55.9

## 2021-07-07 HISTORY — PX: KNEE ARTHROSCOPY WITH LATERAL MENISECTOMY: SHX6193

## 2021-07-07 HISTORY — DX: Personal history of other diseases of the nervous system and sense organs: Z86.69

## 2021-07-07 SURGERY — ARTHROSCOPY, KNEE, WITH LATERAL MENISCECTOMY
Anesthesia: General | Site: Knee | Laterality: Left

## 2021-07-07 MED ORDER — IBUPROFEN 800 MG PO TABS: 800.0000 mg | ORAL_TABLET | Freq: Three times a day (TID) | ORAL | 1 refills | Status: AC

## 2021-07-07 MED ORDER — FENTANYL CITRATE (PF) 100 MCG/2ML IJ SOLN
INTRAMUSCULAR | Status: DC | PRN
  Administered 2021-07-07: 50 ug via INTRAVENOUS

## 2021-07-07 MED ORDER — DEXAMETHASONE SODIUM PHOSPHATE 4 MG/ML IJ SOLN
INTRAMUSCULAR | Status: DC | PRN
  Administered 2021-07-07: 4 mg via INTRAVENOUS

## 2021-07-07 MED ORDER — LACTATED RINGERS IV SOLN: INTRAVENOUS | Status: DC

## 2021-07-07 MED ORDER — LIDOCAINE-EPINEPHRINE 1 %-1:100000 IJ SOLN
INTRAMUSCULAR | Status: DC | PRN
  Administered 2021-07-07: 3 mL via INTRAMUSCULAR

## 2021-07-07 MED ORDER — LACTATED RINGERS IR SOLN
Status: DC | PRN
  Administered 2021-07-07: 12000 mL

## 2021-07-07 MED ORDER — ACETAMINOPHEN 160 MG/5ML PO SOLN: 325.0000 mg | ORAL | Status: DC | PRN

## 2021-07-07 MED ORDER — LIDOCAINE HCL (CARDIAC) PF 100 MG/5ML IV SOSY
PREFILLED_SYRINGE | INTRAVENOUS | Status: DC | PRN
  Administered 2021-07-07: 30 mg via INTRATRACHEAL

## 2021-07-07 MED ORDER — MIDAZOLAM HCL 5 MG/5ML IJ SOLN
INTRAMUSCULAR | Status: DC | PRN
  Administered 2021-07-07: 2 mg via INTRAVENOUS

## 2021-07-07 MED ORDER — DEXMEDETOMIDINE (PRECEDEX) IN NS 20 MCG/5ML (4 MCG/ML) IV SYRINGE
PREFILLED_SYRINGE | INTRAVENOUS | Status: DC | PRN
  Administered 2021-07-07 (×2): 10 ug via INTRAVENOUS

## 2021-07-07 MED ORDER — ASPIRIN EC 325 MG PO TBEC: 325.0000 mg | DELAYED_RELEASE_TABLET | Freq: Every day | ORAL | 0 refills | Status: AC

## 2021-07-07 MED ORDER — ACETAMINOPHEN 500 MG PO TABS: 1000.0000 mg | ORAL_TABLET | Freq: Three times a day (TID) | ORAL | 2 refills | Status: AC

## 2021-07-07 MED ORDER — PROPOFOL 10 MG/ML IV BOLUS
INTRAVENOUS | Status: DC | PRN
  Administered 2021-07-07: 50 mg via INTRAVENOUS
  Administered 2021-07-07: 150 mg via INTRAVENOUS

## 2021-07-07 MED ORDER — HYDROCODONE-ACETAMINOPHEN 5-325 MG PO TABS: 1.0000 | ORAL_TABLET | ORAL | 0 refills | Status: DC | PRN

## 2021-07-07 MED ORDER — FENTANYL CITRATE PF 50 MCG/ML IJ SOSY
25.0000 ug | PREFILLED_SYRINGE | INTRAMUSCULAR | Status: DC | PRN
  Administered 2021-07-07 (×3): 25 ug via INTRAVENOUS

## 2021-07-07 MED ORDER — OXYCODONE HCL 5 MG/5ML PO SOLN: 5.0000 mg | Freq: Once | ORAL | Status: AC | PRN

## 2021-07-07 MED ORDER — CEFAZOLIN SODIUM-DEXTROSE 2-4 GM/100ML-% IV SOLN
2.0000 g | INTRAVENOUS | Status: AC
  Administered 2021-07-07: 2 g via INTRAVENOUS

## 2021-07-07 MED ORDER — KETOROLAC TROMETHAMINE 15 MG/ML IJ SOLN
15.0000 mg | Freq: Four times a day (QID) | INTRAMUSCULAR | Status: DC | PRN
  Administered 2021-07-07: 15 mg via INTRAVENOUS

## 2021-07-07 MED ORDER — ONDANSETRON HCL 4 MG/2ML IJ SOLN
INTRAMUSCULAR | Status: DC | PRN
  Administered 2021-07-07: 4 mg via INTRAVENOUS

## 2021-07-07 MED ORDER — OXYCODONE HCL 5 MG PO TABS
5.0000 mg | ORAL_TABLET | Freq: Once | ORAL | Status: AC | PRN
  Administered 2021-07-07: 5 mg via ORAL

## 2021-07-07 MED ORDER — ACETAMINOPHEN 325 MG PO TABS: 325.0000 mg | ORAL_TABLET | ORAL | Status: DC | PRN

## 2021-07-07 SURGICAL SUPPLY — 39 items
ADAPTER IRRIG TUBE 2 SPIKE SOL (ADAPTER) ×4 IMPLANT
ADPR TBG 2 SPK PMP STRL ASCP (ADAPTER) ×2
APL PRP STRL LF DISP 70% ISPRP (MISCELLANEOUS) ×1
BLADE FULL RADIUS 3.5 (BLADE) ×2 IMPLANT
BLADE SHAVER 4.5X7 STR FR (MISCELLANEOUS) ×2 IMPLANT
BLADE SURG 15 STRL LF DISP TIS (BLADE) ×1 IMPLANT
BLADE SURG 15 STRL SS (BLADE) ×2
BLADE SURG SZ11 CARB STEEL (BLADE) ×2 IMPLANT
BNDG COHESIVE 4X5 TAN ST LF (GAUZE/BANDAGES/DRESSINGS) ×2 IMPLANT
BNDG ESMARK 6X12 TAN STRL LF (GAUZE/BANDAGES/DRESSINGS) ×2 IMPLANT
CHLORAPREP W/TINT 26 (MISCELLANEOUS) ×2 IMPLANT
COOLER POLAR GLACIER W/PUMP (MISCELLANEOUS) ×2 IMPLANT
COVER LIGHT HANDLE UNIVERSAL (MISCELLANEOUS) ×4 IMPLANT
CUFF TOURN SGL QUICK 30 (TOURNIQUET CUFF)
CUFF TRNQT CYL 30X4X21-28X (TOURNIQUET CUFF) IMPLANT
DRAPE EXTREMITY T 121X128X90 (DISPOSABLE) ×2 IMPLANT
DRAPE IMP U-DRAPE 54X76 (DRAPES) ×2 IMPLANT
GAUZE SPONGE 4X4 12PLY STRL (GAUZE/BANDAGES/DRESSINGS) ×2 IMPLANT
GLOVE SURG ENC MOIS LTX SZ7.5 (GLOVE) ×4 IMPLANT
GLOVE SURG UNDER LTX SZ8 (GLOVE) ×2 IMPLANT
GOWN STRL REUS W/ TWL LRG LVL3 (GOWN DISPOSABLE) ×1 IMPLANT
GOWN STRL REUS W/TWL LRG LVL3 (GOWN DISPOSABLE) ×2
IV LACTATED RINGER IRRG 3000ML (IV SOLUTION) ×8
IV LR IRRIG 3000ML ARTHROMATIC (IV SOLUTION) ×4 IMPLANT
KIT TURNOVER KIT A (KITS) ×2 IMPLANT
MANIFOLD NEPTUNE II (INSTRUMENTS) ×2 IMPLANT
MAT ABSORB  FLUID 56X50 GRAY (MISCELLANEOUS) ×2
MAT ABSORB FLUID 56X50 GRAY (MISCELLANEOUS) ×2 IMPLANT
PACK ARTHROSCOPY KNEE (MISCELLANEOUS) ×2 IMPLANT
PAD ABD DERMACEA PRESS 5X9 (GAUZE/BANDAGES/DRESSINGS) ×4 IMPLANT
PAD WRAPON POLAR KNEE (MISCELLANEOUS) ×1 IMPLANT
PADDING CAST BLEND 6X4 STRL (MISCELLANEOUS) ×1 IMPLANT
PADDING STRL CAST 6IN (MISCELLANEOUS) ×1
PICK POWER XL 45DEG (MISCELLANEOUS) ×1 IMPLANT
SUT ETHILON 3 0 FSLX (SUTURE) ×2 IMPLANT
TUBING INFLOW SET DBFLO PUMP (TUBING) ×2 IMPLANT
TUBING OUTFLOW SET DBLFO PUMP (TUBING) ×2 IMPLANT
WAND WEREWOLF FLOW 90D (MISCELLANEOUS) ×2 IMPLANT
WRAPON POLAR PAD KNEE (MISCELLANEOUS) ×2

## 2021-07-07 NOTE — Op Note (Signed)
OPERATIVE NOTE  ? ?SURGERY DATE: 07/07/2021  ?  ?PRE-OP DIAGNOSIS:  ?1. 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 Left knee lateral meniscus tear ?3. Left knee medial compartment degenerative changes ? ?POST-OP DIAGNOSIS:  ?1. 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 Left knee medial compartment degenerative changes ? ?PROCEDURES:  ?1. Left knee arthroscopic lateral femoral condyle microfracture  ?2.  Left knee arthroscopic chondroplasty of the medial femoral condyle ? ?SURGEON: Rosealee Albee, MD ? ?ASSISTANT: Jon Gills, PA-S ?  ?ANESTHESIA: Gen ?  ?TOTAL IV FLUIDS: see anesthesia record ?  ?ESTIMATED BLOOD LOSS: minimal ?  ?SPECIMENS: none ?  ?COMPLICATIONS: None apparent. ?  ?INDICATIONS: ?Alice Andrews is a 41 y.o. female with a history of many years of knee pain.  She has failed extensive nonoperative measures including corticosteroid injections, long-acting corticosteroid injections, viscosupplementation, activity modifications, medical management, and physical therapy.  Preoperative imaging showed possible lateral meniscus tear and chondral defect of the posterior lateral femoral condyle as well as mild degenerative changes of the medial femoral condyle.  The patient is currently unable to do day-to-day activities such as walking and/or climbing stairs without significant pain.. After discussion of risks, benefits, and alternatives to surgery, the patient and family elected to proceed.  We did discuss preoperatively that she may not get complete resolution of her symptoms or that she may need further surgical intervention in the future. ?  ?OPERATIVE FINDINGS:  ?  ?Examination under anesthesia: A careful examination under anesthesia was performed.  Passive range of motion was: Hyperextension: 2.  Extension: 0.  Flexion: 130  Lachman: normal. Pivot Shift: normal.  Posterior drawer: normal.  Varus stability in full extension: normal.  Varus stability in 30 degrees of flexion:  normal.  Valgus stability in full extension: normal.  Valgus stability in 30 degrees of flexion: normal. Patella: 2 quadrants lateral mobility with patella self-reducing, 2 quadrants medial mobility, normal tracking, no lateral tilt.   ?  ?Intra-operative findings: A thorough arthroscopic examination of the knee was performed.  The findings are: ?1. Suprapatellar pouch: normal ?2. Undersurface of median ridge: Grade 1 softening ?3. Medial patellar facet: Grade 1 softening ?4. Lateral patellar facet: Grade 1 softening  ?5. Trochlea: Normal ?6. Lateral gutter/popliteus tendon: Normal ?7. Hoffa's fat pad: Inflamed ?8. Medial gutter/plica: Normal ?9. ACL: Normal ?10. PCL: Normal ?11. Medial meniscus: Normal ?12. Medial compartment cartilage: Grade 2-3 cartilage lesion measuring approximately 20 mm x 5 mm, normal medial tibial plateau ?13. Lateral meniscus: Normal ?14. Lateral compartment cartilage: Grade 4 chondral defect of the lateral femoral condyle measuring approximately 20 mm x 8 mm; normal lateral tibial plateau ?  ?DETAILS OF PROCEDURE: ?The patient was identified in the preoperative holding area and informed consent was obtained. The patient was brought to the operating room and placed supine on the OR table.  A tourniquet was placed on the thigh. The operative extremity was prescrubbed with alcohol, prepped with ChloraPrep, and draped in the usual sterile fashion. The patient was given preoperative IV antibiotics and a surgical time-out confirming patient identity, procedure, and laterality was performed.  ?  ?Arthroscopy portals were marked. Local anesthetic was injected to the planned portal sites. The anterolateral portal was established with an 11 blade. The arthroscope was placed in the anterolateral portal and then into the suprapatellar pouch. Next, the medial portal was established under needle localization. A diagnostic knee scope was completed with the above findings.  ? ?Next, the knee was brought  into  flexion to better visualize the chondral defect of the medial femoral condyle.  A chondroplasty of the medial femoral condyle was performed with an oscillating shaver such that there were stable cartilaginous edges.  Attention was then turned to the lateral femoral condyle chondral defect.  Given the size and shape of the lesion and patient preference to have one surgery, decision was made to proceed with a microfracture procedure as opposed to osteochondral allograft in a staged fashion.  Curette was used to create vertical walls around the patellar defect. Base of defect was appropriately debrided with a curette.  Defect measured 20 mm x 42mm.  An Arthrex PowerPick was used to microfracture the condyle in a standard fashion, and a microfracture awl was used to reach the far posterior portion of the defect. Egress of marrow elements was noted from the microfracture sites after tourniquet was released and arthroscopic fluid was turned off. ?  ?Portal sites were closed with 3-0 Nylon. Xeroform was applied to portal sites. Leg was wrapped in cotton and bias wrap.  Polar Care and hinged knee brace locked at 0 degrees were applied.  The patient was brought to PACU in stable condition. ? ?DISPOSITION: PACU - hemodynamically stable. ?  ?POSTOPERATIVE PLAN: ?The patient will be discharged home today. The patient will be FFWB x 6 weeks with hinged knee brace locked in extension.  Brace to be unlocked for RoM. Physical therapy to begin post-op day 3-4 for knee range of motion, patellar mobilization and scar mobilization.  Start heel slides on postoperative day #1.  Start at 0-40 and advance 5 degrees daily as tolerated.  ASA for DVT prophylaxis. Follow up as outpatient in ~2 weeks.  ? ?

## 2021-07-07 NOTE — Anesthesia Procedure Notes (Signed)
Procedure Name: LMA Insertion ?Date/Time: 07/07/2021 9:38 AM ?Performed by: Maree Krabbe, CRNA ?Pre-anesthesia Checklist: Patient identified, Emergency Drugs available, Suction available, Timeout performed and Patient being monitored ?Patient Re-evaluated:Patient Re-evaluated prior to induction ?Oxygen Delivery Method: Circle system utilized ?Preoxygenation: Pre-oxygenation with 100% oxygen ?Induction Type: IV induction ?LMA: LMA inserted ?LMA Size: 4.0 ?Number of attempts: 1 ?Placement Confirmation: positive ETCO2 and breath sounds checked- equal and bilateral ?Tube secured with: Tape ?Dental Injury: Teeth and Oropharynx as per pre-operative assessment  ? ? ? ? ?

## 2021-07-07 NOTE — H&P (Signed)
Paper H&P to be scanned into permanent record. H&P reviewed. No significant changes noted.  

## 2021-07-07 NOTE — Discharge Instructions (Signed)
Arthroscopic Knee Surgery - Microfracture ?  ?Post-Op Instructions ?  ?1. Bracing or crutches: Crutches will be provided at the time of discharge from the surgery center. Keep brace locked in extension at all times except as directed by physical therapy.  ?  ?2. Ice: You may be provided with a device [facility]) that allows you to ice the affected area effectively. Otherwise you can ice manually.  ?  ?3. Driving:  Plan on not driving for at least 2-4 weeks. Please note that you are advised NOT to drive while taking narcotic pain medications as you may be impaired and unsafe to drive. ?  ?4. Activity: Ankle pumps several times an hour while awake to prevent blood clots. Weight bearing: Flat foot (partial weight bearing - weight of leg only) with brace locked in extension x 6 weeks.  Elevate knee above heart level as much as possible for one week. Avoid standing more than 5 minutes (consecutively) for the first week. No exercise involving the knee until cleared by the surgeon or physical therapist.  Avoid long distance travel for 4 weeks. ? ?Knee Range of motion exercises: ?Start bending and straightening the knee with a towel looped around the foot and pull so the foot slides up the bed. Try to bend the knee at least 30 degrees and advance daily as tolerated. This exercise (heel slide) should be performed ~100 times, 4-6x/daily. These exercises are important to help with microfracture healing. ? ? ?5. Medications:  ?- You have been provided a prescription for narcotic pain medicine. After surgery, take 1-2 narcotic tablets every 4 hours if needed for severe pain. If it has tylenol (acetaminophen), please do not take a total of more than 3000mg /day of tylenol.  ?- A prescription for anti-nausea medication will be provided in case the narcotic medicine causes nausea - take 1 tablet every 6 hours only if nauseated.  ?- Take ibuprofen 800 mg every 8 hours with food to reduce post-operative knee swelling. DO NOT STOP  IBUPROFEN POST-OP UNTIL INSTRUCTED TO DO SO at first post-op office visit (10-14 days after surgery).  ?- Take enteric coated aspirin 325 mg once daily for 4 weeks to prevent blood clots.  ?-Take tylenol 1000 every 8 hours for pain.  May stop tylenol 3 days after surgery or when you are having minimal pain. If your narcotic has tylenol (acetaminophen), please do not take a total of more than 3000mg /day of tylenol.  ?  ?If you are taking prescription medication for anxiety, depression, insomnia, muscle spasm, chronic pain, or for attention deficit disorder you are advised that you are at a higher risk of adverse effects with use of narcotics post-op, including narcotic addiction/dependence, depressed breathing, death. ?If you use non-prescribed substances: alcohol, marijuana, cocaine, heroin, methamphetamines, etc., you are at a higher risk of adverse effects with use of narcotics post-op, including narcotic addiction/dependence, depressed breathing, death. ?You are advised that taking > 50 morphine milligram equivalents (MME) of narcotic pain medication per day results in twice the risk of overdose or death. For your prescription provided: oxycodone 5 mg - taking more than 6 tablets per day. Be advised that we will prescribe narcotics short-term, for acute post-operative pain only - 1 week for minor operations such as knee arthroscopy for meniscus tear resection, and 3 weeks for major operations such as knee repair/reconstruction surgeries.  ? ?6. Bandages: The physical therapist should change the bandages at the first post-op appointment. If needed, the dressing supplies have been provided to  you. You may shower after this with waterproof bandaids covering the incisions.  ?  ?7. Physical Therapy: 2 times per week for the first 4 weeks, then 1-2 times per week from weeks 4-8 post-op. Therapy typically starts on post operative Day 3 or 4. You have been provided an order for physical therapy. The therapist will  provide home exercises. ?  ?8. Work: May return to full work when off of crutches. May do light duty/desk job in approximately 1-2 weeks when off of narcotics, pain is well-controlled, and swelling has decreased. ?  ?9. Post-Op Appointments: ?Your first post-op appointment will be with Dr. Allena Katz in approximately 2 weeks time.  ?  ?If you find that they have not been scheduled please call the Orthopaedic Appointment front desk at [phone]. ? ? ?

## 2021-07-07 NOTE — Anesthesia Preprocedure Evaluation (Addendum)
Anesthesia Evaluation  ?Patient identified by MRN, date of birth, ID band ?Patient awake ? ? ? ?Reviewed: ?Allergy & Precautions, NPO status  ? ?Airway ?Mallampati: II ? ?TM Distance: >3 FB ? ? ? ? Dental ?  ?Pulmonary ?neg recent URI, Patient abstained from smoking., former smoker,  ?  ?Pulmonary exam normal ? ? ? ? ? ? ? Cardiovascular ?negative cardio ROS ? ? ?Rhythm:Regular Rate:Normal ? ? ?  ?Neuro/Psych ?  ? GI/Hepatic ?negative GI ROS,   ?Endo/Other  ?Obesity - BMI > 35 ? Renal/GU ?  ? ?  ?Musculoskeletal ? ?(+) Arthritis ,  ? Abdominal ?  ?Peds ? Hematology ?  ?Anesthesia Other Findings ? ? Reproductive/Obstetrics ? ?  ? ? ? ? ? ? ? ? ? ? ? ? ? ?  ?  ? ? ? ? ? ? ? ?Anesthesia Physical ?Anesthesia Plan ? ?ASA: 2 ? ?Anesthesia Plan: General  ? ?Post-op Pain Management:   ? ?Induction: Intravenous ? ?PONV Risk Score and Plan: 3 and Propofol infusion, TIVA and Treatment may vary due to age or medical condition ? ?Airway Management Planned: Natural Airway and Nasal Cannula ? ?Additional Equipment:  ? ?Intra-op Plan:  ? ?Post-operative Plan:  ? ?Informed Consent: I have reviewed the patients History and Physical, chart, labs and discussed the procedure including the risks, benefits and alternatives for the proposed anesthesia with the patient or authorized representative who has indicated his/her understanding and acceptance.  ? ? ? ? ? ?Plan Discussed with: CRNA ? ?Anesthesia Plan Comments:   ? ? ? ? ? ? ?Anesthesia Quick Evaluation ? ?

## 2021-07-07 NOTE — Transfer of Care (Signed)
Immediate Anesthesia Transfer of Care Note ? ?Patient: Alice Andrews ? ?Procedure(s) Performed: Left knee arthroscopy with lateral femoral microfracture, medial femoral chondroplasty (Left: Knee) ? ?Patient Location: PACU ? ?Anesthesia Type: General ? ?Level of Consciousness: awake, alert  and patient cooperative ? ?Airway and Oxygen Therapy: Patient Spontanous Breathing and Patient connected to supplemental oxygen ? ?Post-op Assessment: Post-op Vital signs reviewed, Patient's Cardiovascular Status Stable, Respiratory Function Stable, Patent Airway and No signs of Nausea or vomiting ? ?Post-op Vital Signs: Reviewed and stable ? ?Complications: No notable events documented. ? ?

## 2021-07-07 NOTE — Anesthesia Postprocedure Evaluation (Signed)
Anesthesia Post Note ? ?Patient: Alice Andrews ? ?Procedure(s) Performed: Left knee arthroscopy with lateral femoral microfracture, medial femoral chondroplasty (Left: Knee) ? ? ?  ?Patient location during evaluation: PACU ?Anesthesia Type: General ?Level of consciousness: awake ?Pain management: pain level controlled ?Vital Signs Assessment: post-procedure vital signs reviewed and stable ?Respiratory status: respiratory function stable ?Cardiovascular status: stable ?Postop Assessment: no signs of nausea or vomiting ?Anesthetic complications: no ? ? ?No notable events documented. ? ?Veda Canning ? ? ? ? ? ?

## 2021-07-12 ENCOUNTER — Inpatient Hospital Stay: Admission: RE | Admit: 2021-07-12 | Payer: BC Managed Care – PPO | Source: Ambulatory Visit

## 2021-08-23 ENCOUNTER — Other Ambulatory Visit: Payer: Self-pay | Admitting: Orthopedic Surgery

## 2021-08-23 DIAGNOSIS — G8929 Other chronic pain: Secondary | ICD-10-CM

## 2021-08-31 ENCOUNTER — Ambulatory Visit
Admission: RE | Admit: 2021-08-31 | Discharge: 2021-08-31 | Disposition: A | Discharge status: Home / Self Care | Payer: BC Managed Care – PPO | Source: Ambulatory Visit | Attending: Orthopedic Surgery | Admitting: Orthopedic Surgery

## 2021-08-31 DIAGNOSIS — G8929 Other chronic pain: Secondary | ICD-10-CM | POA: Insufficient documentation

## 2021-08-31 DIAGNOSIS — M25561 Pain in right knee: Secondary | ICD-10-CM | POA: Diagnosis present

## 2021-09-12 ENCOUNTER — Other Ambulatory Visit: Payer: Self-pay | Admitting: Gerontology

## 2021-09-12 DIAGNOSIS — N6459 Other signs and symptoms in breast: Secondary | ICD-10-CM

## 2021-09-14 ENCOUNTER — Other Ambulatory Visit: Payer: Self-pay | Admitting: Gerontology

## 2021-09-14 DIAGNOSIS — N6489 Other specified disorders of breast: Secondary | ICD-10-CM

## 2021-09-14 DIAGNOSIS — N63 Unspecified lump in unspecified breast: Secondary | ICD-10-CM

## 2021-09-14 DIAGNOSIS — Z1231 Encounter for screening mammogram for malignant neoplasm of breast: Secondary | ICD-10-CM

## 2021-09-15 ENCOUNTER — Inpatient Hospital Stay
Admission: RE | Admit: 2021-09-15 | Discharge: 2021-09-15 | Disposition: A | Discharge status: Home / Self Care | Payer: Self-pay | Source: Ambulatory Visit | Attending: *Deleted | Admitting: *Deleted

## 2021-09-15 ENCOUNTER — Other Ambulatory Visit: Payer: Self-pay | Admitting: *Deleted

## 2021-09-15 DIAGNOSIS — Z1231 Encounter for screening mammogram for malignant neoplasm of breast: Secondary | ICD-10-CM

## 2021-09-19 ENCOUNTER — Encounter: Payer: Self-pay | Admitting: Urology

## 2021-09-19 ENCOUNTER — Other Ambulatory Visit: Payer: Self-pay | Admitting: *Deleted

## 2021-09-19 ENCOUNTER — Other Ambulatory Visit
Admission: RE | Admit: 2021-09-19 | Discharge: 2021-09-19 | Disposition: A | Discharge status: Home / Self Care | Payer: BC Managed Care – PPO | Attending: Urology | Admitting: Urology

## 2021-09-19 ENCOUNTER — Ambulatory Visit (INDEPENDENT_AMBULATORY_CARE_PROVIDER_SITE_OTHER): Payer: BC Managed Care – PPO | Admitting: Urology

## 2021-09-19 VITALS — BP 117/81 | HR 95 | Ht 60.0 in | Wt 178.0 lb

## 2021-09-19 DIAGNOSIS — N390 Urinary tract infection, site not specified: Secondary | ICD-10-CM | POA: Insufficient documentation

## 2021-09-19 LAB — URINALYSIS, COMPLETE (UACMP) WITH MICROSCOPIC
Bilirubin Urine: NEGATIVE
Glucose, UA: NEGATIVE mg/dL
Hgb urine dipstick: NEGATIVE
Ketones, ur: 15 mg/dL — AB
Leukocytes,Ua: NEGATIVE
Nitrite: NEGATIVE
Protein, ur: NEGATIVE mg/dL
Specific Gravity, Urine: 1.025 (ref 1.005–1.030)
pH: 6 (ref 5.0–8.0)

## 2021-09-19 MED ORDER — CEPHALEXIN 250 MG PO CAPS
500.0000 mg | ORAL_CAPSULE | Freq: Once | ORAL | Status: AC
  Administered 2021-09-19: 500 mg via ORAL

## 2021-09-19 NOTE — Progress Notes (Signed)
Cystoscopy Procedure Note:  Indication: Recurrent UTIs, flank pain  After informed consent and discussion of the procedure and its risks, Alice Andrews was positioned and prepped in the standard fashion. Cystoscopy was performed with a flexible cystoscope. The urethra, bladder neck and entire bladder was visualized in a standard fashion. The bladder mucosa was grossly normal throughout.  No abnormalities on retroflexion.  The ureteral orifices were visualized in their normal location and orientation.   Imaging: CT urogram ordered, but has not been completed  Findings: Normal cystoscopy  Assessment and Plan: Call with CT results, if CT completely normal again strongly recommend topical estrogen cream.  She never started this medication secondary to concerns about possible side effects  Legrand Rams, MD 09/19/2021

## 2021-09-19 NOTE — Patient Instructions (Addendum)
Please call [phone] to schedule CT scan.

## 2021-09-20 LAB — MISC LABCORP TEST (SEND OUT): Labcorp test code: 86884

## 2021-09-20 LAB — URINE CULTURE: Culture: NO GROWTH

## 2021-09-21 ENCOUNTER — Telehealth: Payer: Self-pay

## 2021-09-21 NOTE — Telephone Encounter (Signed)
-----   Message from Sondra Come, MD sent at 09/21/2021  7:49 AM EDT ----- Good news, no infection on urine culture, will call with CT results  Legrand Rams, MD 09/21/2021

## 2021-09-21 NOTE — Telephone Encounter (Signed)
Called pt no answer. LM for pt informing her of negative results.  

## 2021-09-26 ENCOUNTER — Telehealth: Payer: Self-pay

## 2021-09-26 DIAGNOSIS — A493 Mycoplasma infection, unspecified site: Secondary | ICD-10-CM

## 2021-09-26 MED ORDER — DOXYCYCLINE HYCLATE 100 MG PO CAPS: 100.0000 mg | ORAL_CAPSULE | Freq: Two times a day (BID) | ORAL | 0 refills | Status: DC

## 2021-09-26 NOTE — Telephone Encounter (Signed)
-----   Message from Sondra Come, MD sent at 09/26/2021  8:27 AM EDT ----- Her atypical culture ultimately came back positive(different than the regular culture that results were called 6/15), please start doxycycline 100 mg twice daily x7 days, I am very hopeful this will improve her bothersome urinary symptoms.  Legrand Rams, MD 09/26/2021

## 2021-09-26 NOTE — Telephone Encounter (Signed)
Called pt, no answer. Unable to leave message as no voicemail is set up. RX sent. 1st attempt.

## 2021-10-03 ENCOUNTER — Inpatient Hospital Stay: Admission: RE | Admit: 2021-10-03 | Payer: BC Managed Care – PPO | Source: Ambulatory Visit

## 2021-10-03 ENCOUNTER — Other Ambulatory Visit: Payer: BC Managed Care – PPO

## 2022-03-22 ENCOUNTER — Encounter: Payer: Self-pay | Admitting: Gerontology

## 2022-03-23 ENCOUNTER — Other Ambulatory Visit: Payer: Self-pay | Admitting: Gerontology

## 2022-03-23 DIAGNOSIS — Z1832 Retained tooth: Secondary | ICD-10-CM

## 2022-03-23 DIAGNOSIS — H7092 Unspecified mastoiditis, left ear: Secondary | ICD-10-CM

## 2022-03-23 DIAGNOSIS — G519 Disorder of facial nerve, unspecified: Secondary | ICD-10-CM

## 2022-03-23 DIAGNOSIS — K047 Periapical abscess without sinus: Secondary | ICD-10-CM

## 2022-04-09 HISTORY — PX: OTHER SURGICAL HISTORY: SHX169

## 2022-04-17 ENCOUNTER — Other Ambulatory Visit: Payer: BC Managed Care – PPO

## 2022-07-20 ENCOUNTER — Other Ambulatory Visit: Payer: Self-pay | Admitting: Orthopedic Surgery

## 2022-07-20 DIAGNOSIS — G8929 Other chronic pain: Secondary | ICD-10-CM

## 2022-07-25 ENCOUNTER — Encounter: Payer: Self-pay | Admitting: Orthopedic Surgery

## 2022-07-28 ENCOUNTER — Ambulatory Visit: Payer: BC Managed Care – PPO

## 2022-08-09 ENCOUNTER — Other Ambulatory Visit: Payer: BC Managed Care – PPO

## 2022-09-11 ENCOUNTER — Encounter: Payer: Self-pay | Admitting: Orthopedic Surgery

## 2022-09-12 ENCOUNTER — Other Ambulatory Visit: Payer: BC Managed Care – PPO

## 2022-09-14 ENCOUNTER — Other Ambulatory Visit: Payer: Self-pay | Admitting: Gerontology

## 2022-09-14 DIAGNOSIS — N644 Mastodynia: Secondary | ICD-10-CM

## 2022-09-22 ENCOUNTER — Other Ambulatory Visit: Payer: BC Managed Care – PPO

## 2022-10-05 ENCOUNTER — Ambulatory Visit
Admission: RE | Admit: 2022-10-05 | Discharge: 2022-10-05 | Disposition: A | Discharge status: Home / Self Care | Payer: BC Managed Care – PPO | Source: Ambulatory Visit | Attending: Gerontology | Admitting: Gerontology

## 2022-10-05 DIAGNOSIS — N644 Mastodynia: Secondary | ICD-10-CM | POA: Diagnosis present

## 2022-10-12 ENCOUNTER — Encounter: Payer: Self-pay | Admitting: Orthopedic Surgery

## 2022-10-15 ENCOUNTER — Ambulatory Visit: Admission: RE | Admit: 2022-10-15 | Payer: BC Managed Care – PPO | Source: Ambulatory Visit

## 2022-10-20 ENCOUNTER — Ambulatory Visit
Admission: RE | Admit: 2022-10-20 | Discharge: 2022-10-20 | Disposition: A | Discharge status: Home / Self Care | Payer: BC Managed Care – PPO | Source: Ambulatory Visit | Attending: Orthopedic Surgery | Admitting: Orthopedic Surgery

## 2022-10-20 DIAGNOSIS — G8929 Other chronic pain: Secondary | ICD-10-CM

## 2022-11-21 IMAGING — MR MR KNEE*R* W/O CM
7 series · 40 of 40 positions shown · non-contrast
Comparison: Radiographs dated March 29, 2015.

CLINICAL DATA: Right knee pain since 2174. Progressively got worse.
Difficulty bearing weight.

EXAM:
MRI OF THE RIGHT KNEE WITHOUT CONTRAST
TECHNIQUE: Multiplanar, multisequence MR imaging of the right knee was
performed. No intravenous contrast was administered.

[Series 8: T2 fat-sat · axial · right · 4.0mm · 0.50mm/px · z∈[-59,+63]mm · 6 of 26 slices shown (1 of 3)]
[im 1/26]
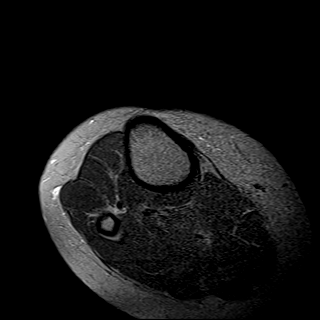
[im 6/26]
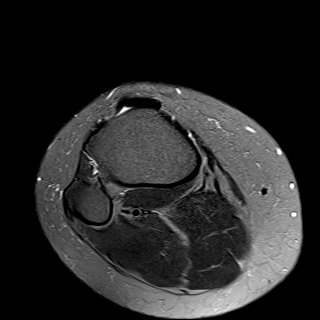
[im 11/26]
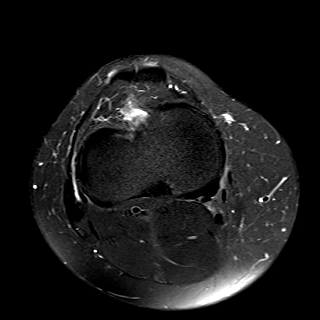
[im 16/26]
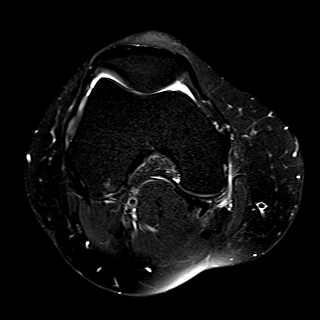
[im 21/26]
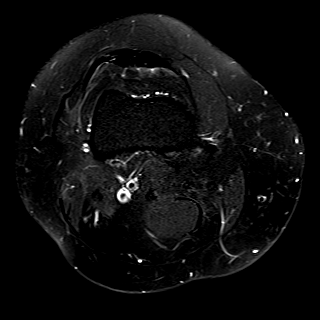
[im 26/26]
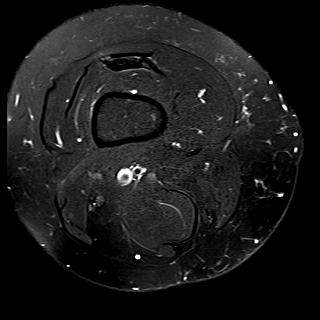

[Series 9: T2 fat-sat · coronal · right · 4.0mm · 0.47mm/px · 6 of 32 slices shown (2 of 3)]
[im 1/32]
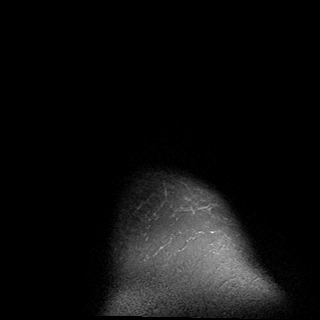
[im 7/32]
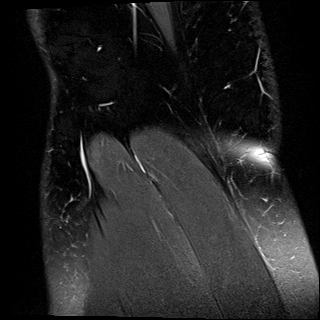
[im 13/32]
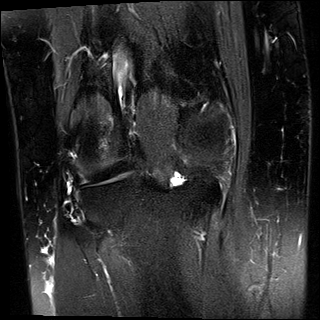
[im 19/32]
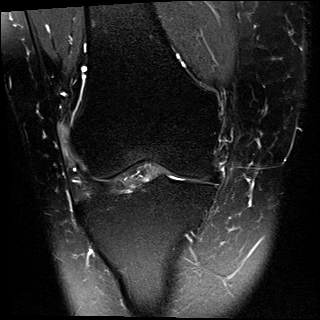
[im 25/32]
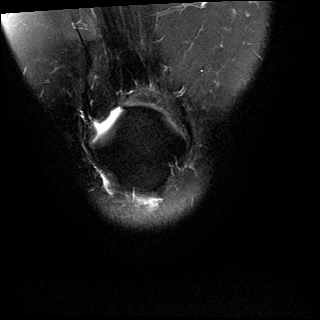
[im 32/32]
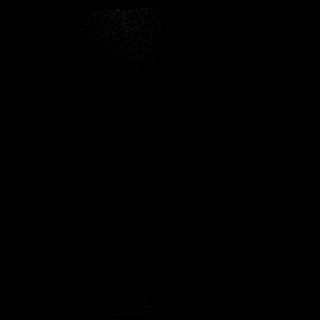

[Series 10: T1 · coronal · right · 4.0mm · 0.47mm/px · 6 of 32 slices shown]
[im 1/32]
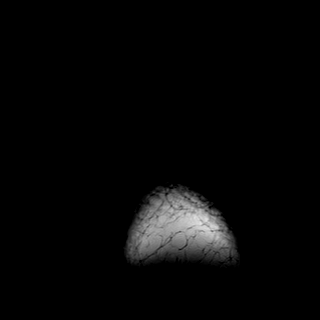
[im 7/32]
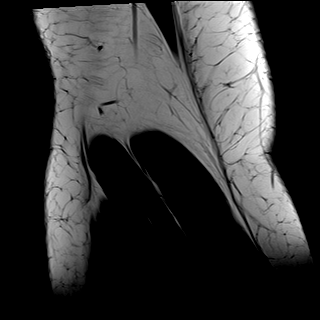
[im 13/32]
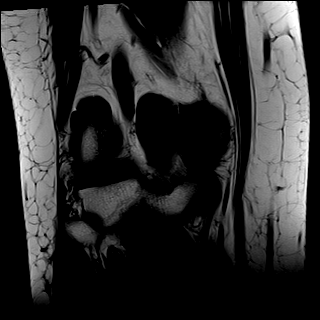
[im 19/32]
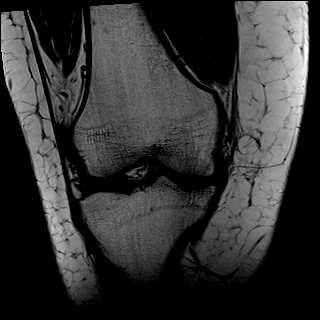
[im 25/32]
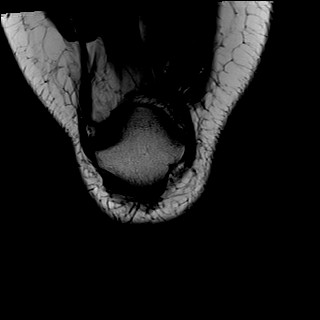
[im 32/32]
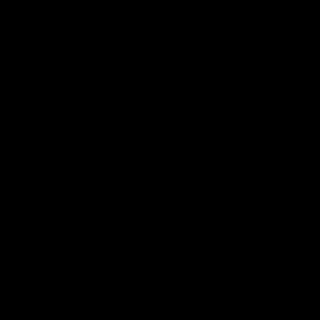

[Series 11: PD fat-sat · coronal · right · 4.0mm · 0.59mm/px · 6 of 32 slices shown (1 of 2)]
[im 1/32]
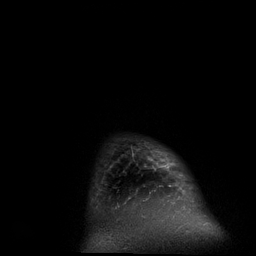
[im 7/32]
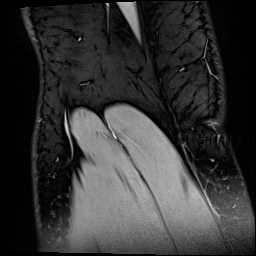
[im 13/32]
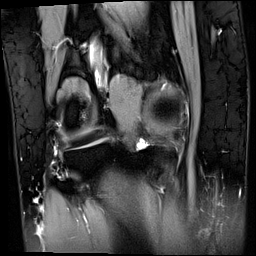
[im 19/32]
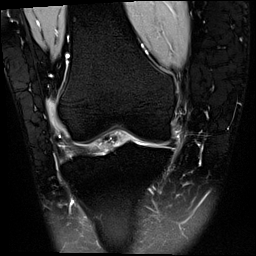
[im 25/32]
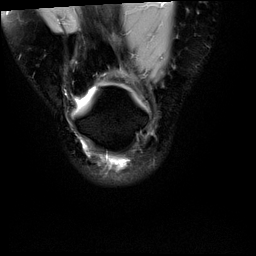
[im 32/32]
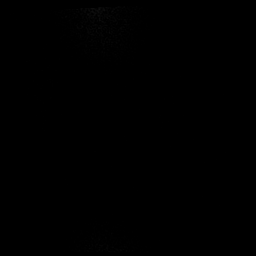

[Series 12: PD fat-sat · sagittal · right · 3.0mm · 0.47mm/px · 6 of 33 slices shown (2 of 2)]
[im 1/33]
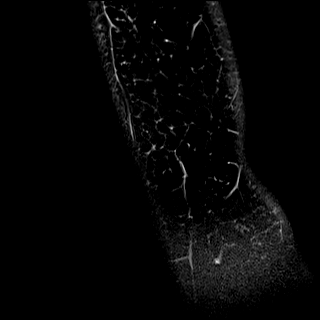
[im 7/33]
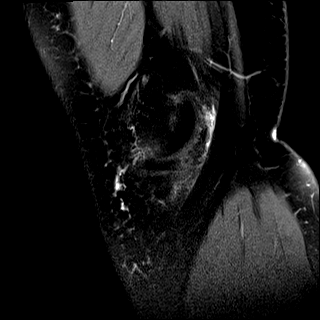
[im 13/33]
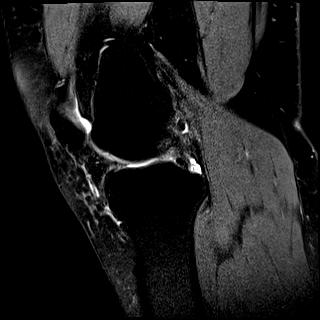
[im 20/33]
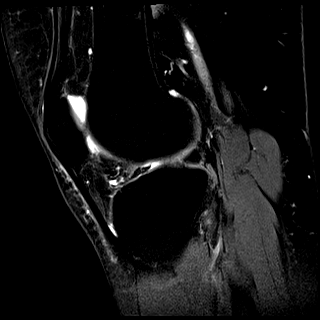
[im 26/33]
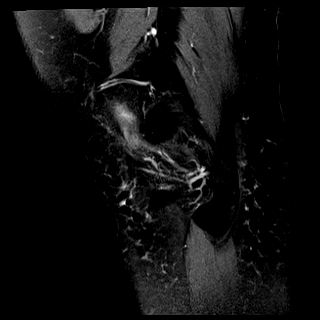
[im 33/33]
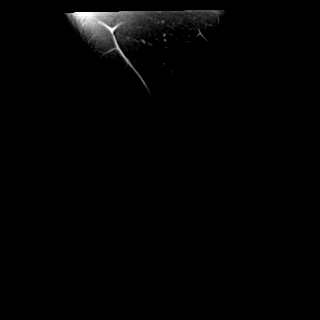

[Series 13: T2 fat-sat · sagittal · right · 3.0mm · 0.47mm/px · 7 of 36 slices shown (3 of 3)]
[im 1/36]
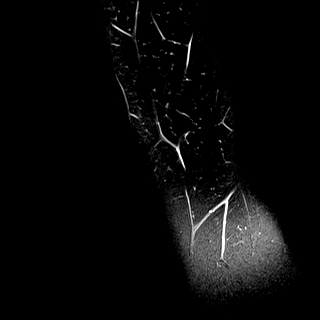
[im 6/36]
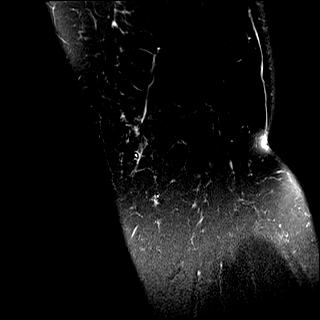
[im 12/36]
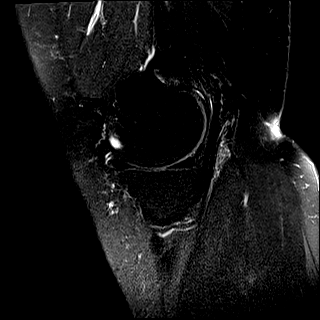
[im 18/36]
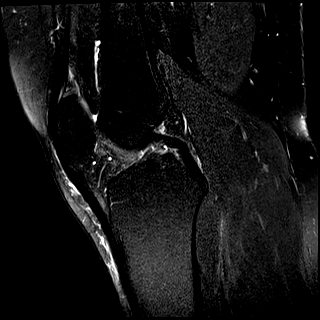
[im 24/36]
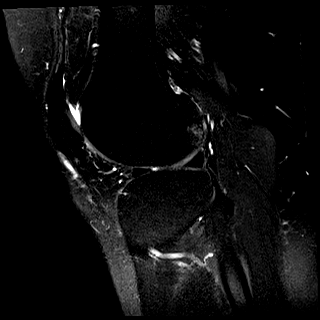
[im 30/36]
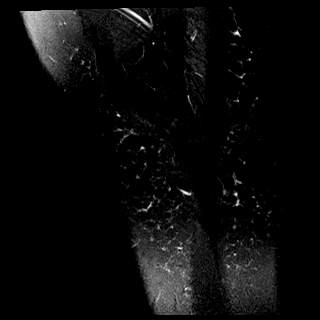
[im 36/36]
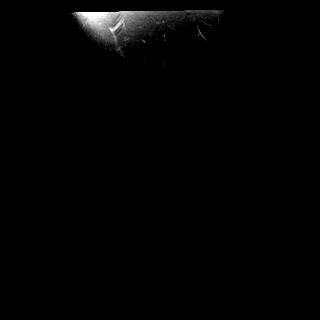

[Series 14: PD · coronal · right · 2.0mm · 0.47mm/px · 3 of 16 slices shown]
[im 1/16]
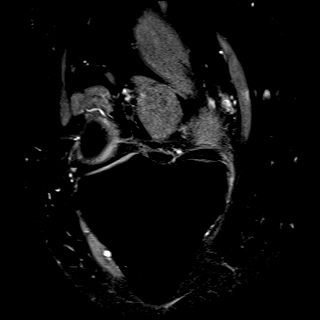
[im 8/16]
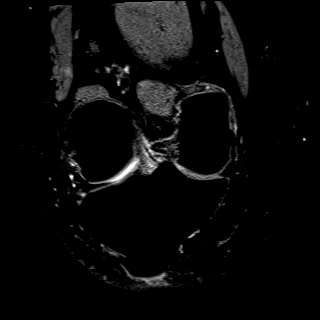
[im 16/16]
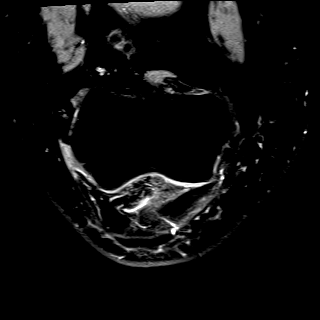

[40 of 40 positions shown; findings below may reference images not displayed]

FINDINGS: MENISCI

Medial: Degenerative changes without discrete tear. Parameniscal
cyst adjacent to the posterior horn measuring approximately 5 mm.

Lateral: Degenerative changes without discrete tear.

LIGAMENTS

Cruciates: PCL is intact. Thickening and increased signal of the ACL
suggesting early mucoid degeneration.

Collaterals: Medial collateral ligament is intact. Lateral
collateral ligament complex is intact. Edema adjacent to the
iliotibial band.

CARTILAGE

Patellofemoral:  No chondral defect.

Medial: Mild partial-thickness cartilage loss of the medial
femorotibial compartment.

Lateral: Superficial fissuring at the weight-bearing area without
evidence of full-thickness defect.

JOINT: Small joint effusion. Normal B-Lee Jeff. No plical
thickening.

POPLITEAL FOSSA: Popliteus tendon is intact. No Baker's cyst.

EXTENSOR MECHANISM: Intact quadriceps tendon. Intact patellar
tendon. Intact lateral patellar retinaculum. Intact medial patellar
retinaculum. Intact MPFL.

BONES: No aggressive osseous lesion. No fracture or dislocation.

Other: No fluid collection or hematoma. Muscles are normal.
IMPRESSION: 1. Degenerative changes of the medial meniscus with a small 5 mm
parameniscal cyst. No appreciable tear.

2. Thickening and heterogeneous signal of the ACL concerning for
early mucoid degeneration.

3. Mild edema along the iliotibial band, concerning for iliotibial
band syndrome, correlate with clinical history.

4. Tricompartmental mild degenerative changes without evidence of
full-thickness cartilage defect or appreciable joint effusion.

5. Marrow signal is within normal limits. No evidence of fracture or
osteonecrosis.

## 2023-01-21 ENCOUNTER — Other Ambulatory Visit: Payer: Self-pay | Admitting: Gerontology

## 2023-01-21 DIAGNOSIS — R1031 Right lower quadrant pain: Secondary | ICD-10-CM

## 2023-01-21 DIAGNOSIS — N83201 Unspecified ovarian cyst, right side: Secondary | ICD-10-CM

## 2023-01-28 ENCOUNTER — Ambulatory Visit: Admission: RE | Admit: 2023-01-28 | Payer: BC Managed Care – PPO | Source: Ambulatory Visit

## 2023-01-31 ENCOUNTER — Other Ambulatory Visit: Payer: Self-pay | Admitting: Orthopedic Surgery

## 2023-02-06 ENCOUNTER — Encounter
Admission: RE | Admit: 2023-02-06 | Discharge: 2023-02-06 | Disposition: A | Discharge status: Home / Self Care | Payer: BC Managed Care – PPO | Source: Ambulatory Visit | Attending: Orthopedic Surgery | Admitting: Orthopedic Surgery

## 2023-02-06 VITALS — BP 114/69 | HR 68 | Resp 12 | Ht 60.0 in | Wt 170.0 lb

## 2023-02-06 DIAGNOSIS — Z01818 Encounter for other preprocedural examination: Secondary | ICD-10-CM | POA: Diagnosis present

## 2023-02-06 DIAGNOSIS — Z01810 Encounter for preprocedural cardiovascular examination: Secondary | ICD-10-CM | POA: Diagnosis not present

## 2023-02-06 HISTORY — DX: Intra-abdominal and pelvic swelling, mass and lump, unspecified site: R19.00

## 2023-02-06 HISTORY — DX: Idiopathic aseptic necrosis of unspecified femur: M87.059

## 2023-02-06 HISTORY — DX: Unspecified osteoarthritis, unspecified site: M19.90

## 2023-02-06 HISTORY — DX: Unspecified ovarian cyst, right side: N83.201

## 2023-02-06 HISTORY — DX: Other chronic pain: G89.29

## 2023-02-06 HISTORY — DX: Anemia, unspecified: D64.9

## 2023-02-06 HISTORY — DX: Unilateral primary osteoarthritis, left hip: M16.12

## 2023-02-06 HISTORY — DX: Personal history of nicotine dependence: Z87.891

## 2023-02-06 LAB — URINALYSIS, ROUTINE W REFLEX MICROSCOPIC
Bilirubin Urine: NEGATIVE
Glucose, UA: NEGATIVE mg/dL
Hgb urine dipstick: NEGATIVE
Ketones, ur: NEGATIVE mg/dL
Leukocytes,Ua: NEGATIVE
Nitrite: NEGATIVE
Protein, ur: NEGATIVE mg/dL
Specific Gravity, Urine: 1.012 (ref 1.005–1.030)
pH: 8 (ref 5.0–8.0)

## 2023-02-06 LAB — COMPREHENSIVE METABOLIC PANEL
ALT: 14 U/L (ref 0–44)
AST: 13 U/L — ABNORMAL LOW (ref 15–41)
Albumin: 4.3 g/dL (ref 3.5–5.0)
Alkaline Phosphatase: 53 U/L (ref 38–126)
Anion gap: 6 (ref 5–15)
BUN: 8 mg/dL (ref 6–20)
CO2: 24 mmol/L (ref 22–32)
Calcium: 8.6 mg/dL — ABNORMAL LOW (ref 8.9–10.3)
Chloride: 104 mmol/L (ref 98–111)
Creatinine, Ser: 0.65 mg/dL (ref 0.44–1.00)
GFR, Estimated: >60 mL/min (ref >60)
Glucose, Bld: 92 mg/dL (ref 70–99)
Potassium: 3.8 mmol/L (ref 3.5–5.1)
Sodium: 134 mmol/L — ABNORMAL LOW (ref 135–145)
Total Bilirubin: 0.8 mg/dL (ref 0.3–1.2)
Total Protein: 7.1 g/dL (ref 6.5–8.1)

## 2023-02-06 LAB — CBC WITH DIFFERENTIAL/PLATELET
Abs Immature Granulocytes: 0.01 10*3/uL (ref 0.00–0.07)
Basophils Absolute: 0.1 10*3/uL (ref 0.0–0.1)
Basophils Relative: 1 %
Eosinophils Absolute: 0.1 10*3/uL (ref 0.0–0.5)
Eosinophils Relative: 2 %
HCT: 36.5 % (ref 36.0–46.0)
Hemoglobin: 12.9 g/dL (ref 12.0–15.0)
Immature Granulocytes: 0 %
Lymphocytes Relative: 38 %
Lymphs Abs: 1.4 10*3/uL (ref 0.7–4.0)
MCH: 33.8 pg (ref 26.0–34.0)
MCHC: 35.3 g/dL (ref 30.0–36.0)
MCV: 95.5 fL (ref 80.0–100.0)
Monocytes Absolute: 0.3 10*3/uL (ref 0.1–1.0)
Monocytes Relative: 9 %
Neutro Abs: 1.8 10*3/uL (ref 1.7–7.7)
Neutrophils Relative %: 50 %
Platelets: 296 10*3/uL (ref 150–400)
RBC: 3.82 MIL/uL — ABNORMAL LOW (ref 3.87–5.11)
RDW: 11.9 % (ref 11.5–15.5)
WBC: 3.6 10*3/uL — ABNORMAL LOW (ref 4.0–10.5)
nRBC: 0 % (ref 0.0–0.2)

## 2023-02-06 LAB — SURGICAL PCR SCREEN
MRSA, PCR: NEGATIVE
Staphylococcus aureus: NEGATIVE

## 2023-02-06 LAB — TYPE AND SCREEN
ABO/RH(D): O POS
Antibody Screen: NEGATIVE

## 2023-02-06 NOTE — Patient Instructions (Addendum)
Your procedure is scheduled on:02-18-23 Monday Report to the Registration Desk on the 1st floor of the [facility].Then proceed to the 2nd floor Surgery Desk To find out your arrival time, please call [phone] between 1PM - 3PM on:02-15-23 Friday If your arrival time is 6:00 am, do not arrive before that time as the [facility] entrance doors do not open until 6:00 am.  REMEMBER: Instructions that are not followed completely may result in serious medical risk, up to and including death; or upon the discretion of your surgeon and anesthesiologist your surgery may need to be rescheduled.  Do not eat food after midnight the night before surgery.  No gum chewing or hard candies.  You may however, drink CLEAR liquids up to 2 hours before you are scheduled to arrive for your surgery. Do not drink anything within 2 hours of your scheduled arrival time.  Clear liquids include: - water  - apple juice without pulp - gatorade (not RED colors) - black coffee or tea (Do NOT add milk or creamers to the coffee or tea) Do NOT drink anything that is not on this list.  In addition, your doctor has ordered for you to drink the provided:  Ensure Pre-Surgery Clear Carbohydrate Drink  Drinking this carbohydrate drink up to two hours before surgery helps to reduce insulin resistance and improve patient outcomes. Please complete drinking 2 hours before scheduled arrival time.  One week prior to surgery:Last dose on 02-10-23  Stop Anti-inflammatories (NSAIDS) such as Advil, Aleve, Ibuprofen, Motrin, Naproxen, Naprosyn and Aspirin based products such as Excedrin, Goody's Powder, BC Powder. Stop ANY OVER THE COUNTER supplements until after surgery (Vitamin D3)  You may however, continue to take Tylenol if needed for pain up until the day of surgery.  Continue taking all of your other prescription medications up until the day of surgery.  Do NOT take any medication the day of surgery  No Alcohol for 24  hours before or after surgery.  No Smoking including e-cigarettes for 24 hours before surgery.  No chewable tobacco products for at least 6 hours before surgery.  No nicotine patches on the day of surgery.  Do not use any "recreational" drugs for at least a week (preferably 2 weeks) before your surgery.  Please be advised that the combination of cocaine and anesthesia may have negative outcomes, up to and including death. If you test positive for cocaine, your surgery will be cancelled.  On the morning of surgery brush your teeth with toothpaste and water, you may rinse your mouth with mouthwash if you wish. Do not swallow any toothpaste or mouthwash.  Use CHG Soap as directed on instruction sheet.  Do not wear jewelry, make-up, hairpins, clips or nail polish.  For welded (permanent) jewelry: bracelets, anklets, waist bands, etc.  Please have this removed prior to surgery.  If it is not removed, there is a chance that hospital personnel will need to cut it off on the day of surgery.  Do not wear lotions, powders, or perfumes.   Do not shave body hair from the neck down 48 hours before surgery.  Contact lenses, hearing aids and dentures may not be worn into surgery.  Do not bring valuables to the hospital. [facility] is not responsible for any missing/lost belongings or valuables.   Notify your doctor if there is any change in your medical condition (cold, fever, infection).  Wear comfortable clothing (specific to your surgery type) to the hospital.  After surgery, you  can help prevent lung complications by doing breathing exercises.  Take deep breaths and cough every 1-2 hours. Your doctor may order a device called an Incentive Spirometer to help you take deep breaths. When coughing or sneezing, hold a pillow firmly against your incision with both hands. This is called "splinting." Doing this helps protect your incision. It also decreases belly discomfort.  If you are being  admitted to the hospital overnight, leave your suitcase in the car. After surgery it may be brought to your room.  In case of increased patient census, it may be necessary for you, the patient, to continue your postoperative care in the Same Day Surgery department.  If you are being discharged the day of surgery, you will not be allowed to drive home. You will need a responsible individual to drive you home and stay with you for 24 hours after surgery.   If you are taking public transportation, you will need to have a responsible individual with you.  Please call the Pre-admissions Testing Dept. at [phone] if you have any questions about these instructions.  Surgery Visitation Policy:  Patients having surgery or a procedure may have two visitors.  Children under the age of 64 must have an adult with them who is not the patient.  Inpatient Visitation:    Visiting hours are 7 a.m. to 8 p.m. Up to four visitors are allowed at one time in a patient room. The visitors may rotate out with other people during the day.  One visitor age 109 or older may stay with the patient overnight and must be in the room by 8 p.m.    Pre-operative 5 CHG Bath Instructions   You can play a key role in reducing the risk of infection after surgery. Your skin needs to be as free of germs as possible. You can reduce the number of germs on your skin by washing with CHG (chlorhexidine gluconate) soap before surgery. CHG is an antiseptic soap that kills germs and continues to kill germs even after washing.   DO NOT use if you have an allergy to chlorhexidine/CHG or antibacterial soaps. If your skin becomes reddened or irritated, stop using the CHG and notify one of our RNs at [phone].   Please shower with the CHG soap starting 4 days before surgery using the following schedule:     Please keep in mind the following:  DO NOT shave, including legs and underarms, starting the day of your first shower.    You may shave your face at any point before/day of surgery.  Place clean sheets on your bed the day you start using CHG soap. Use a clean washcloth (not used since being washed) for each shower. DO NOT sleep with pets once you start using the CHG.   CHG Shower Instructions:  If you choose to wash your hair and private area, wash first with your normal shampoo/soap.  After you use shampoo/soap, rinse your hair and body thoroughly to remove shampoo/soap residue.  Turn the water OFF and apply about 3 tablespoons (45 ml) of CHG soap to a CLEAN washcloth.  Apply CHG soap ONLY FROM YOUR NECK DOWN TO YOUR TOES (washing for 3-5 minutes)  DO NOT use CHG soap on face, private areas, open wounds, or sores.  Pay special attention to the area where your surgery is being performed.  If you are having back surgery, having someone wash your back for you may be helpful. Wait 2 minutes after CHG soap  is applied, then you may rinse off the CHG soap.  Pat dry with a clean towel  Put on clean clothes/pajamas   If you choose to wear lotion, please use ONLY the CHG-compatible lotions on the back of this paper.     Additional instructions for the day of surgery: DO NOT APPLY any lotions, deodorants, cologne, or perfumes.   Put on clean/comfortable clothes.  Brush your teeth.  Ask your nurse before applying any prescription medications to the skin.      CHG Compatible Lotions   Aveeno Moisturizing lotion  Cetaphil Moisturizing Cream  Cetaphil Moisturizing Lotion  Clairol Herbal Essence Moisturizing Lotion, Dry Skin  Clairol Herbal Essence Moisturizing Lotion, Extra Dry Skin  Clairol Herbal Essence Moisturizing Lotion, Normal Skin  Curel Age Defying Therapeutic Moisturizing Lotion with Alpha Hydroxy  Curel Extreme Care Body Lotion  Curel Soothing Hands Moisturizing Hand Lotion  Curel Therapeutic Moisturizing Cream, Fragrance-Free  Curel Therapeutic Moisturizing Lotion, Fragrance-Free  Curel  Therapeutic Moisturizing Lotion, Original Formula  Eucerin Daily Replenishing Lotion  Eucerin Dry Skin Therapy Plus Alpha Hydroxy Crme  Eucerin Dry Skin Therapy Plus Alpha Hydroxy Lotion  Eucerin Original Crme  Eucerin Original Lotion  Eucerin Plus Crme Eucerin Plus Lotion  Eucerin TriLipid Replenishing Lotion  Keri Anti-Bacterial Hand Lotion  Keri Deep Conditioning Original Lotion Dry Skin Formula Softly Scented  Keri Deep Conditioning Original Lotion, Fragrance Free Sensitive Skin Formula  Keri Lotion Fast Absorbing Fragrance Free Sensitive Skin Formula  Keri Lotion Fast Absorbing Softly Scented Dry Skin Formula  Keri Original Lotion  Keri Skin Renewal Lotion Keri Silky Smooth Lotion  Keri Silky Smooth Sensitive Skin Lotion  Nivea Body Creamy Conditioning Oil  Nivea Body Extra Enriched Lotion  Nivea Body Original Lotion  Nivea Body Sheer Moisturizing Lotion Nivea Crme  Nivea Skin Firming Lotion  NutraDerm 30 Skin Lotion  NutraDerm Skin Lotion  NutraDerm Therapeutic Skin Cream  NutraDerm Therapeutic Skin Lotion  ProShield Protective Hand Cream  Provon moisturizing lotion  How to Use an Incentive Spirometer An incentive spirometer is a tool that measures how well you are filling your lungs with each breath. Learning to take long, deep breaths using this tool can help you keep your lungs clear and active. This may help to reverse or lessen your chance of developing breathing (pulmonary) problems, especially infection. You may be asked to use a spirometer: After a surgery. If you have a lung problem or a history of smoking. After a long period of time when you have been unable to move or be active. If the spirometer includes an indicator to show the highest number that you have reached, your health care provider or respiratory therapist will help you set a goal. Keep a log of your progress as told by your health care provider. What are the risks? Breathing too quickly may cause  dizziness or cause you to pass out. Take your time so you do not get dizzy or light-headed. If you are in pain, you may need to take pain medicine before doing incentive spirometry. It is harder to take a deep breath if you are having pain. How to use your incentive spirometer  Sit up on the edge of your bed or on a chair. Hold the incentive spirometer so that it is in an upright position. Before you use the spirometer, breathe out normally. Place the mouthpiece in your mouth. Make sure your lips are closed tightly around it. Breathe in slowly and as deeply as you can through  your mouth, causing the piston or the ball to rise toward the top of the chamber. Hold your breath for 3-5 seconds, or for as long as possible. If the spirometer includes a coach indicator, use this to guide you in breathing. Slow down your breathing if the indicator goes above the marked areas. Remove the mouthpiece from your mouth and breathe out normally. The piston or ball will return to the bottom of the chamber. Rest for a few seconds, then repeat the steps 10 or more times. Take your time and take a few normal breaths between deep breaths so that you do not get dizzy or light-headed. Do this every 1-2 hours when you are awake. If the spirometer includes a goal marker to show the highest number you have reached (best effort), use this as a goal to work toward during each repetition. After each set of 10 deep breaths, cough a few times. This will help to make sure that your lungs are clear. If you have an incision on your chest or abdomen from surgery, place a pillow or a rolled-up towel firmly against the incision when you cough. This can help to reduce pain while taking deep breaths and coughing. General tips When you are able to get out of bed: Walk around often. Continue to take deep breaths and cough in order to clear your lungs. Keep using the incentive spirometer until your health care provider says it is okay  to stop using it. If you have been in the hospital, you may be told to keep using the spirometer at home. Contact a health care provider if: You are having difficulty using the spirometer. You have trouble using the spirometer as often as instructed. Your pain medicine is not giving enough relief for you to use the spirometer as told. You have a fever. Get help right away if: You develop shortness of breath. You develop a cough with bloody mucus from the lungs. You have fluid or blood coming from an incision site after you cough. Summary An incentive spirometer is a tool that can help you learn to take long, deep breaths to keep your lungs clear and active. You may be asked to use a spirometer after a surgery, if you have a lung problem or a history of smoking, or if you have been inactive for a long period of time. Use your incentive spirometer as instructed every 1-2 hours while you are awake. If you have an incision on your chest or abdomen, place a pillow or a rolled-up towel firmly against your incision when you cough. This will help to reduce pain. Get help right away if you have shortness of breath, you cough up bloody mucus, or blood comes from your incision when you cough. This information is not intended to replace advice given to you by your health care provider. Make sure you discuss any questions you have with your health care provider. Document Revised: 06/15/2019 Document Reviewed: 06/15/2019 Elsevier Patient Education  2024 [employer].

## 2023-02-17 MED ORDER — CEFAZOLIN SODIUM-DEXTROSE 2-4 GM/100ML-% IV SOLN
2.0000 g | INTRAVENOUS | Status: AC
  Administered 2023-02-18: 2 g via INTRAVENOUS

## 2023-02-17 MED ORDER — LACTATED RINGERS IV SOLN
INTRAVENOUS | Status: DC
Start: 2023-02-17 — End: 2023-02-18

## 2023-02-17 MED ORDER — DEXAMETHASONE SODIUM PHOSPHATE 10 MG/ML IJ SOLN: 8.0000 mg | Freq: Once | INTRAMUSCULAR | Status: DC

## 2023-02-17 MED ORDER — CHLORHEXIDINE GLUCONATE 0.12 % MT SOLN
15.0000 mL | Freq: Once | OROMUCOSAL | Status: AC
  Administered 2023-02-18: 15 mL via OROMUCOSAL

## 2023-02-17 MED ORDER — TRANEXAMIC ACID-NACL 1000-0.7 MG/100ML-% IV SOLN
1000.0000 mg | INTRAVENOUS | Status: AC
  Administered 2023-02-18 (×2): 1000 mg via INTRAVENOUS

## 2023-02-17 MED ORDER — ORAL CARE MOUTH RINSE
15.0000 mL | Freq: Once | OROMUCOSAL | Status: AC
Start: 2023-02-17 — End: 2023-02-18

## 2023-02-18 ENCOUNTER — Encounter
Admission: RE | Disposition: A | Discharge status: Home Health | Payer: Self-pay | Source: Ambulatory Visit | Attending: Orthopedic Surgery

## 2023-02-18 ENCOUNTER — Encounter: Payer: Self-pay | Admitting: Orthopedic Surgery

## 2023-02-18 ENCOUNTER — Ambulatory Visit: Payer: BC Managed Care – PPO

## 2023-02-18 ENCOUNTER — Other Ambulatory Visit: Payer: Self-pay

## 2023-02-18 ENCOUNTER — Ambulatory Visit: Payer: BC Managed Care – PPO | Admitting: Urgent Care

## 2023-02-18 ENCOUNTER — Ambulatory Visit: Payer: BC Managed Care – PPO | Admitting: Anesthesiology

## 2023-02-18 ENCOUNTER — Observation Stay
Admission: RE | Admit: 2023-02-18 | Discharge: 2023-02-19 | Disposition: A | Discharge status: Home Health | Payer: BC Managed Care – PPO | Source: Ambulatory Visit | Attending: Orthopedic Surgery | Admitting: Orthopedic Surgery

## 2023-02-18 DIAGNOSIS — M87852 Other osteonecrosis, left femur: Secondary | ICD-10-CM | POA: Insufficient documentation

## 2023-02-18 DIAGNOSIS — M1612 Unilateral primary osteoarthritis, left hip: Principal | ICD-10-CM | POA: Insufficient documentation

## 2023-02-18 DIAGNOSIS — Z96642 Presence of left artificial hip joint: Principal | ICD-10-CM | POA: Diagnosis present

## 2023-02-18 HISTORY — PX: TOTAL HIP ARTHROPLASTY: SHX124

## 2023-02-18 SURGERY — ARTHROPLASTY, HIP, TOTAL, ANTERIOR APPROACH
Anesthesia: Spinal | Site: Hip | Laterality: Left

## 2023-02-18 MED ORDER — OXYCODONE HCL 5 MG PO TABS
ORAL_TABLET | ORAL | Status: AC
  Filled 2023-02-18: qty 1

## 2023-02-18 MED ORDER — DOCUSATE SODIUM 100 MG PO CAPS
ORAL_CAPSULE | ORAL | Status: AC
  Filled 2023-02-18: qty 1

## 2023-02-18 MED ORDER — SODIUM CHLORIDE FLUSH 0.9 % IV SOLN
INTRAVENOUS | Status: AC
  Filled 2023-02-18: qty 10

## 2023-02-18 MED ORDER — ONDANSETRON HCL 4 MG/2ML IJ SOLN
INTRAMUSCULAR | Status: DC | PRN
  Administered 2023-02-18: 4 mg via INTRAVENOUS

## 2023-02-18 MED ORDER — 0.9 % SODIUM CHLORIDE (POUR BTL) OPTIME
TOPICAL | Status: DC | PRN
  Administered 2023-02-18: 500 mL

## 2023-02-18 MED ORDER — METOCLOPRAMIDE HCL 5 MG PO TABS: 5.0000 mg | ORAL_TABLET | Freq: Three times a day (TID) | ORAL | Status: DC | PRN

## 2023-02-18 MED ORDER — ACETAMINOPHEN 500 MG PO TABS
1000.0000 mg | ORAL_TABLET | Freq: Three times a day (TID) | ORAL | Status: DC
  Administered 2023-02-18 – 2023-02-19 (×3): 1000 mg via ORAL

## 2023-02-18 MED ORDER — ACETAMINOPHEN 500 MG PO TABS
ORAL_TABLET | ORAL | Status: AC
  Filled 2023-02-18: qty 2

## 2023-02-18 MED ORDER — PROPOFOL 500 MG/50ML IV EMUL
INTRAVENOUS | Status: DC | PRN
  Administered 2023-02-18: 100 ug/kg/min via INTRAVENOUS

## 2023-02-18 MED ORDER — SODIUM CHLORIDE 0.9 % IV SOLN: INTRAVENOUS | Status: DC

## 2023-02-18 MED ORDER — DEXAMETHASONE SODIUM PHOSPHATE 10 MG/ML IJ SOLN
INTRAMUSCULAR | Status: DC | PRN
  Administered 2023-02-18: 8 mg via INTRAVENOUS

## 2023-02-18 MED ORDER — TRANEXAMIC ACID-NACL 1000-0.7 MG/100ML-% IV SOLN
INTRAVENOUS | Status: AC
  Filled 2023-02-18: qty 100

## 2023-02-18 MED ORDER — PHENOL 1.4 % MT LIQD: 1.0000 | OROMUCOSAL | Status: DC | PRN

## 2023-02-18 MED ORDER — KETOROLAC TROMETHAMINE 15 MG/ML IJ SOLN
INTRAMUSCULAR | Status: AC
Start: 2023-02-18 — End: ?
  Filled 2023-02-18: qty 1

## 2023-02-18 MED ORDER — METOCLOPRAMIDE HCL 5 MG/ML IJ SOLN: 5.0000 mg | Freq: Three times a day (TID) | INTRAMUSCULAR | Status: DC | PRN

## 2023-02-18 MED ORDER — OXYCODONE HCL 5 MG PO TABS
5.0000 mg | ORAL_TABLET | ORAL | Status: DC | PRN
  Administered 2023-02-18 (×2): 10 mg via ORAL

## 2023-02-18 MED ORDER — CEFAZOLIN SODIUM-DEXTROSE 2-4 GM/100ML-% IV SOLN
INTRAVENOUS | Status: AC
  Filled 2023-02-18: qty 100

## 2023-02-18 MED ORDER — METHOCARBAMOL 500 MG PO TABS
500.0000 mg | ORAL_TABLET | Freq: Three times a day (TID) | ORAL | Status: DC | PRN
  Administered 2023-02-18 (×2): 500 mg via ORAL

## 2023-02-18 MED ORDER — METHOCARBAMOL 500 MG PO TABS
ORAL_TABLET | ORAL | Status: AC
  Filled 2023-02-18: qty 1

## 2023-02-18 MED ORDER — OXYCODONE HCL 5 MG/5ML PO SOLN: 5.0000 mg | Freq: Once | ORAL | Status: DC | PRN

## 2023-02-18 MED ORDER — SODIUM CHLORIDE (PF) 0.9 % IJ SOLN
INTRAMUSCULAR | Status: DC | PRN
  Administered 2023-02-18: 50 mL via INTRAMUSCULAR

## 2023-02-18 MED ORDER — MIDAZOLAM HCL 5 MG/5ML IJ SOLN
INTRAMUSCULAR | Status: DC | PRN
  Administered 2023-02-18: 2 mg via INTRAVENOUS

## 2023-02-18 MED ORDER — DEXAMETHASONE SODIUM PHOSPHATE 10 MG/ML IJ SOLN
INTRAMUSCULAR | Status: AC
  Filled 2023-02-18: qty 1

## 2023-02-18 MED ORDER — EPHEDRINE SULFATE-NACL 50-0.9 MG/10ML-% IV SOSY
PREFILLED_SYRINGE | INTRAVENOUS | Status: DC | PRN
  Administered 2023-02-18: 5 mg via INTRAVENOUS

## 2023-02-18 MED ORDER — SODIUM CHLORIDE 0.9 % IR SOLN
Status: DC | PRN
  Administered 2023-02-18: 50 mL

## 2023-02-18 MED ORDER — PANTOPRAZOLE SODIUM 40 MG PO TBEC
DELAYED_RELEASE_TABLET | ORAL | Status: AC
Start: 2023-02-18 — End: ?
  Filled 2023-02-18: qty 1

## 2023-02-18 MED ORDER — MIDAZOLAM HCL 2 MG/2ML IJ SOLN
INTRAMUSCULAR | Status: AC
  Filled 2023-02-18: qty 2

## 2023-02-18 MED ORDER — OXYCODONE HCL 5 MG PO TABS
ORAL_TABLET | ORAL | Status: AC
  Filled 2023-02-18: qty 2

## 2023-02-18 MED ORDER — ACETAMINOPHEN 10 MG/ML IV SOLN
INTRAVENOUS | Status: DC | PRN
  Administered 2023-02-18: 1000 mg via INTRAVENOUS

## 2023-02-18 MED ORDER — HYDROCODONE-ACETAMINOPHEN 5-325 MG PO TABS
ORAL_TABLET | ORAL | Status: AC
  Filled 2023-02-18: qty 2

## 2023-02-18 MED ORDER — MORPHINE SULFATE (PF) 4 MG/ML IV SOLN
INTRAVENOUS | Status: AC
  Filled 2023-02-18: qty 1

## 2023-02-18 MED ORDER — ONDANSETRON HCL 4 MG/2ML IJ SOLN: 4.0000 mg | Freq: Four times a day (QID) | INTRAMUSCULAR | Status: DC | PRN

## 2023-02-18 MED ORDER — OXYCODONE HCL 5 MG PO TABS: 5.0000 mg | ORAL_TABLET | Freq: Once | ORAL | Status: DC | PRN

## 2023-02-18 MED ORDER — KETOROLAC TROMETHAMINE 15 MG/ML IJ SOLN
15.0000 mg | Freq: Four times a day (QID) | INTRAMUSCULAR | Status: AC
  Administered 2023-02-18 – 2023-02-19 (×4): 15 mg via INTRAVENOUS

## 2023-02-18 MED ORDER — OXYCODONE HCL 5 MG PO TABS
5.0000 mg | ORAL_TABLET | Freq: Once | ORAL | Status: AC
  Administered 2023-02-18: 5 mg via ORAL

## 2023-02-18 MED ORDER — CHLORHEXIDINE GLUCONATE 0.12 % MT SOLN
OROMUCOSAL | Status: AC
  Filled 2023-02-18: qty 15

## 2023-02-18 MED ORDER — PROPOFOL 10 MG/ML IV BOLUS
INTRAVENOUS | Status: DC | PRN
  Administered 2023-02-18: 60 mg via INTRAVENOUS
  Administered 2023-02-18: 40 mg via INTRAVENOUS
  Administered 2023-02-18: 30 mg via INTRAVENOUS

## 2023-02-18 MED ORDER — CEFAZOLIN SODIUM-DEXTROSE 2-4 GM/100ML-% IV SOLN
2.0000 g | Freq: Four times a day (QID) | INTRAVENOUS | Status: AC
Start: 2023-02-18 — End: 2023-02-18
  Administered 2023-02-18 (×2): 2 g via INTRAVENOUS

## 2023-02-18 MED ORDER — FENTANYL CITRATE (PF) 100 MCG/2ML IJ SOLN: 25.0000 ug | INTRAMUSCULAR | Status: DC | PRN

## 2023-02-18 MED ORDER — ENOXAPARIN SODIUM 40 MG/0.4ML IJ SOSY
40.0000 mg | PREFILLED_SYRINGE | INTRAMUSCULAR | Status: DC
  Administered 2023-02-19: 40 mg via SUBCUTANEOUS

## 2023-02-18 MED ORDER — MENTHOL 3 MG MT LOZG: 1.0000 | LOZENGE | OROMUCOSAL | Status: DC | PRN

## 2023-02-18 MED ORDER — DOCUSATE SODIUM 100 MG PO CAPS
ORAL_CAPSULE | ORAL | Status: AC
Start: 2023-02-18 — End: ?
  Filled 2023-02-18: qty 1

## 2023-02-18 MED ORDER — FENTANYL CITRATE (PF) 100 MCG/2ML IJ SOLN
INTRAMUSCULAR | Status: DC | PRN
  Administered 2023-02-18 (×2): 50 ug via INTRAVENOUS

## 2023-02-18 MED ORDER — TRAMADOL HCL 50 MG PO TABS
50.0000 mg | ORAL_TABLET | Freq: Four times a day (QID) | ORAL | Status: DC | PRN
  Administered 2023-02-18 – 2023-02-19 (×3): 50 mg via ORAL

## 2023-02-18 MED ORDER — ONDANSETRON HCL 4 MG/2ML IJ SOLN
INTRAMUSCULAR | Status: AC
Start: 2023-02-18 — End: ?
  Filled 2023-02-18: qty 2

## 2023-02-18 MED ORDER — PROPOFOL 1000 MG/100ML IV EMUL
INTRAVENOUS | Status: AC
Start: 2023-02-18 — End: ?
  Filled 2023-02-18: qty 100

## 2023-02-18 MED ORDER — MORPHINE SULFATE (PF) 4 MG/ML IV SOLN
0.5000 mg | INTRAVENOUS | Status: DC | PRN
  Administered 2023-02-18: 1 mg via INTRAVENOUS
  Administered 2023-02-18: 0.52 mg via INTRAVENOUS
  Administered 2023-02-19: 1 mg via INTRAVENOUS

## 2023-02-18 MED ORDER — SURGIRINSE WOUND IRRIGATION SYSTEM - OPTIME
TOPICAL | Status: DC | PRN
  Administered 2023-02-18: 900 mL via TOPICAL

## 2023-02-18 MED ORDER — PANTOPRAZOLE SODIUM 40 MG PO TBEC
40.0000 mg | DELAYED_RELEASE_TABLET | Freq: Every day | ORAL | Status: DC
  Administered 2023-02-18 – 2023-02-19 (×2): 40 mg via ORAL

## 2023-02-18 MED ORDER — BUPIVACAINE HCL (PF) 0.5 % IJ SOLN
INTRAMUSCULAR | Status: DC | PRN
  Administered 2023-02-18: 2.8 mL via INTRATHECAL

## 2023-02-18 MED ORDER — TRAMADOL HCL 50 MG PO TABS
ORAL_TABLET | ORAL | Status: AC
  Filled 2023-02-18: qty 1

## 2023-02-18 MED ORDER — ACETAMINOPHEN 10 MG/ML IV SOLN
INTRAVENOUS | Status: AC
  Filled 2023-02-18: qty 100

## 2023-02-18 MED ORDER — ONDANSETRON HCL 4 MG PO TABS: 4.0000 mg | ORAL_TABLET | Freq: Four times a day (QID) | ORAL | Status: DC | PRN

## 2023-02-18 MED ORDER — DOCUSATE SODIUM 100 MG PO CAPS
100.0000 mg | ORAL_CAPSULE | Freq: Two times a day (BID) | ORAL | Status: DC
  Administered 2023-02-18 – 2023-02-19 (×3): 100 mg via ORAL

## 2023-02-18 MED ORDER — KETOROLAC TROMETHAMINE 15 MG/ML IJ SOLN
INTRAMUSCULAR | Status: AC
  Filled 2023-02-18: qty 1

## 2023-02-18 MED ORDER — FENTANYL CITRATE (PF) 100 MCG/2ML IJ SOLN
INTRAMUSCULAR | Status: AC
  Filled 2023-02-18: qty 2

## 2023-02-18 MED ORDER — TRANEXAMIC ACID-NACL 1000-0.7 MG/100ML-% IV SOLN
INTRAVENOUS | Status: AC
Start: 2023-02-18 — End: ?
  Filled 2023-02-18: qty 100

## 2023-02-18 MED ORDER — BUPIVACAINE LIPOSOME 1.3 % IJ SUSP
INTRAMUSCULAR | Status: AC
  Filled 2023-02-18: qty 20

## 2023-02-18 MED ORDER — BUPIVACAINE-EPINEPHRINE (PF) 0.25% -1:200000 IJ SOLN
INTRAMUSCULAR | Status: AC
  Filled 2023-02-18: qty 30

## 2023-02-18 SURGICAL SUPPLY — 74 items
ADH SKN CLS APL DERMABOND .7 (GAUZE/BANDAGES/DRESSINGS) ×1
AGENT HMST KT MTR STRL THRMB (HEMOSTASIS)
APL PRP STRL LF DISP 70% ISPRP (MISCELLANEOUS) ×1
BIT DRILL TRIDENT 4X40 SU (BIT) IMPLANT
BLADE SAGITTAL AGGR TOOTH XLG (BLADE) ×1 IMPLANT
BNDG CMPR 5X6 CHSV STRCH STRL (GAUZE/BANDAGES/DRESSINGS) ×2
BNDG COHESIVE 6X5 TAN ST LF (GAUZE/BANDAGES/DRESSINGS) ×2 IMPLANT
CHLORAPREP W/TINT 26 (MISCELLANEOUS) ×1 IMPLANT
DERMABOND ADVANCED .7 DNX12 (GAUZE/BANDAGES/DRESSINGS) ×1 IMPLANT
DRAPE C-ARM XRAY 36X54 (DRAPES) ×1 IMPLANT
DRAPE POUCH INSTRU U-SHP 10X18 (DRAPES) ×1 IMPLANT
DRAPE SHEET LG 3/4 BI-LAMINATE (DRAPES) ×3 IMPLANT
DRAPE TABLE BACK 80X90 (DRAPES) ×1 IMPLANT
DRSG MEPILEX SACRM 8.7X9.8 (GAUZE/BANDAGES/DRESSINGS) ×1 IMPLANT
DRSG OPSITE POSTOP 4X8 (GAUZE/BANDAGES/DRESSINGS) ×1 IMPLANT
ELECT BLADE 4.0 EZ CLEAN MEGAD (MISCELLANEOUS) ×1
ELECT REM PT RETURN 9FT ADLT (ELECTROSURGICAL) ×1
ELECTRODE BLDE 4.0 EZ CLN MEGD (MISCELLANEOUS) ×1 IMPLANT
ELECTRODE REM PT RTRN 9FT ADLT (ELECTROSURGICAL) ×1 IMPLANT
GLOVE BIO SURGEON STRL SZ8 (GLOVE) ×1 IMPLANT
GLOVE BIOGEL PI IND STRL 8 (GLOVE) ×1 IMPLANT
GLOVE PI ORTHO PRO STRL 7.5 (GLOVE) ×2 IMPLANT
GLOVE PI ORTHO PRO STRL SZ8 (GLOVE) ×2 IMPLANT
GLOVE SURG SYN 7.5 E (GLOVE) ×1 IMPLANT
GLOVE SURG SYN 7.5 PF PI (GLOVE) ×1 IMPLANT
GOWN SRG XL LVL 3 NONREINFORCE (GOWNS) ×1 IMPLANT
GOWN STRL NON-REIN TWL XL LVL3 (GOWNS) ×1
GOWN STRL REUS W/ TWL LRG LVL3 (GOWN DISPOSABLE) ×1 IMPLANT
GOWN STRL REUS W/ TWL XL LVL3 (GOWN DISPOSABLE) ×1 IMPLANT
GOWN STRL REUS W/TWL LRG LVL3 (GOWN DISPOSABLE) ×1
GOWN STRL REUS W/TWL XL LVL3 (GOWN DISPOSABLE) ×1
HANDLE YANKAUER SUCT OPEN TIP (MISCELLANEOUS) ×1 IMPLANT
HEAD CERAMIC FEMORAL 36MM (Head) IMPLANT
HOOD PEEL AWAY T7 (MISCELLANEOUS) ×2 IMPLANT
INSERT 0 DEGREE 36 (Miscellaneous) IMPLANT
IV NS 100ML SINGLE PACK (IV SOLUTION) ×1 IMPLANT
KIT PATIENT CARE HANA TABLE (KITS) ×1 IMPLANT
LIGHT WAVEGUIDE WIDE FLAT (MISCELLANEOUS) ×1 IMPLANT
MANIFOLD NEPTUNE II (INSTRUMENTS) ×1 IMPLANT
MARKER SKIN DUAL TIP RULER LAB (MISCELLANEOUS) ×1 IMPLANT
MAT ABSORB FLUID 56X50 GRAY (MISCELLANEOUS) ×1 IMPLANT
NDL FILTER BLUNT 18X1 1/2 (NEEDLE) IMPLANT
NDL SAFETY ECLIPSE 18X1.5 (NEEDLE) ×1 IMPLANT
NDL SPNL 20GX3.5 QUINCKE YW (NEEDLE) ×1 IMPLANT
NEEDLE FILTER BLUNT 18X1 1/2 (NEEDLE) IMPLANT
NEEDLE SPNL 20GX3.5 QUINCKE YW (NEEDLE) ×1 IMPLANT
NS IRRIG 500ML POUR BTL (IV SOLUTION) ×1 IMPLANT
PACK HIP COMPR (MISCELLANEOUS) ×1 IMPLANT
PAD ARMBOARD 7.5X6 YLW CONV (MISCELLANEOUS) ×1 IMPLANT
SCREW HEX LP 6.5X15 (Screw) IMPLANT
SCREW HEX LP 6.5X30 (Screw) IMPLANT
SHELL ACETAB TRIDENT 48 (Shell) IMPLANT
SLEEVE SCD COMPRESS KNEE MED (STOCKING) ×1 IMPLANT
SOLUTION IRRIG SURGIPHOR (IV SOLUTION) ×1 IMPLANT
STEM STD OFFSET SZ3 32.5 (Stem) IMPLANT
SURGIFLO W/THROMBIN 8M KIT (HEMOSTASIS) IMPLANT
SUT BONE WAX W31G (SUTURE) ×1 IMPLANT
SUT ETHIBOND 2 V 37 (SUTURE) ×1 IMPLANT
SUT QUILL MONODERM 3-0 PS-2 (SUTURE) ×1 IMPLANT
SUT SILK 0 (SUTURE) ×1
SUT SILK 0 30XBRD TIE 6 (SUTURE) ×1 IMPLANT
SUT STRATA 1 CT-1 DLB (SUTURE) ×1
SUT STRATAFIX 14 PDO 48 VLT (SUTURE) ×1 IMPLANT
SUT STRATAFIX PDO 1 14 VIOLET (SUTURE)
SUT VIC AB 0 CT1 36 (SUTURE) ×1 IMPLANT
SUT VIC AB 2-0 CT2 27 (SUTURE) ×2 IMPLANT
SUTURE STRATA SPIR 4-0 18 (SUTURE) IMPLANT
SYR 30ML LL (SYRINGE) ×2 IMPLANT
SYR TB 1ML LL NO SAFETY (SYRINGE) IMPLANT
TAPE MICROFOAM 4IN (TAPE) IMPLANT
TOWEL OR 17X26 4PK STRL BLUE (TOWEL DISPOSABLE) IMPLANT
TRAP FLUID SMOKE EVACUATOR (MISCELLANEOUS) ×1 IMPLANT
WAND WEREWOLF FASTSEAL 6.0 (MISCELLANEOUS) ×1 IMPLANT
WATER STERILE IRR 1000ML POUR (IV SOLUTION) ×1 IMPLANT

## 2023-02-18 NOTE — Evaluation (Signed)
Physical Therapy Evaluation Patient Details Name: Alice Andrews MRN: [id number] DOB: [birth year] Today's Date: 02/18/2023  History of Present Illness  Pt is a 42 y/o F admitted on 02/18/23 for L THA, direct anterior approach. PMH: chronic pain, insomnia  Clinical Impression  Pt seen for PT evaluation with pt agreeable to tx. Pt reports prior to admission she was ambulatory without AD but endorses ~5 falls in the past 6 months 2/2 LLE giving out on her. PT provides pt with HEP handout & assists pt with exercises PRN. Pt is able to progress to walking in hallway with RW & CGA. Anticipate pt will progress well with plans to attempt stair negotiation tomorrow morning.        If plan is discharge home, recommend the following: A little help with walking and/or transfers;A little help with bathing/dressing/bathroom;Assistance with cooking/housework;Assist for transportation;Help with stairs or ramp for entrance   Can travel by private vehicle        Equipment Recommendations Rolling walker (2 wheels);BSC/3in1  Recommendations for Other Services       Functional Status Assessment Patient has had a recent decline in their functional status and demonstrates the ability to make significant improvements in function in a reasonable and predictable amount of time.     Precautions / Restrictions Precautions Precautions: Fall Restrictions Weight Bearing Restrictions: Yes LLE Weight Bearing: Weight bearing as tolerated      Mobility  Bed Mobility Overal bed mobility: Needs Assistance Bed Mobility: Supine to Sit     Supine to sit: Min assist, HOB elevated, Used rails (assistance to move LLE to EOB on R)          Transfers Overall transfer level: Needs assistance Equipment used: Rolling walker (2 wheels) Transfers: Sit to/from Stand Sit to Stand: Contact guard assist           General transfer comment: STS from EOB with cuing re: hand placement, sequencing, technique     Ambulation/Gait Ambulation/Gait assistance: Contact guard assist Gait Distance (Feet): 100 Feet   Gait Pattern/deviations: Decreased stride length, Decreased weight shift to left Gait velocity: decreased        Stairs            Wheelchair Mobility     Tilt Bed    Modified Rankin (Stroke Patients Only)       Balance Overall balance assessment: Needs assistance Sitting-balance support: Feet supported Sitting balance-Leahy Scale: Fair     Standing balance support: During functional activity, Bilateral upper extremity supported, Reliant on assistive device for balance Standing balance-Leahy Scale: Fair                               Pertinent Vitals/Pain Pain Assessment Pain Assessment: 0-10 Pain Score: 10-Worst pain ever Pain Location: L hip Pain Descriptors / Indicators: Grimacing, Guarding, Discomfort, Sore Pain Intervention(s): Monitored during session, Premedicated before session, Limited activity within patient's tolerance    Home Living Family/patient expects to be discharged to:: Private residence Living Arrangements: Children;Spouse/significant other Available Help at Discharge: Family Type of Home: House Home Access: Stairs to enter Entrance Stairs-Rails: Right Entrance Stairs-Number of Steps: 2   Home Layout: One level        Prior Function Prior Level of Function : Independent/Modified Independent;Working/employed;Driving;History of Falls (last six months)             Mobility Comments: works from home, limited ambulation 2/2 pain, notes 5 falls in the  past 6 months 2/2 LLE "giving out"       Extremity/Trunk Assessment   Upper Extremity Assessment Upper Extremity Assessment: Overall WFL for tasks assessed    Lower Extremity Assessment Lower Extremity Assessment: LLE deficits/detail LLE Deficits / Details: 3-/5 knee extension in sitting    Cervical / Trunk Assessment Cervical / Trunk Assessment: Normal   Communication   Communication Communication: No apparent difficulties  Cognition Arousal: Alert Behavior During Therapy: WFL for tasks assessed/performed Overall Cognitive Status: Within Functional Limits for tasks assessed                                          General Comments      Exercises Total Joint Exercises Ankle Circles/Pumps: AROM, Supine, Left, 10 reps Quad Sets: AROM, Supine, Left, Strengthening, 10 reps Gluteal Sets: AROM, Supine, Strengthening, 10 reps Towel Squeeze: AROM, Supine, Strengthening, 10 reps (hip adduction pillow squeeze) Short Arc Quad: AROM, Supine, Strengthening, Left, 10 reps Heel Slides: Supine, AAROM, Strengthening, Left, 10 reps Hip ABduction/ADduction: Supine, AAROM, Strengthening, Left, 10 reps (hip abduction slides) Straight Leg Raises: Supine, AAROM, Strengthening, Left, 10 reps Long Arc Quad: AROM, Strengthening, Left, Seated, 10 reps   Assessment/Plan    PT Assessment Patient needs continued PT services  PT Problem List Decreased strength;Pain;Decreased range of motion;Decreased activity tolerance;Decreased balance;Decreased mobility;Decreased knowledge of use of DME;Decreased safety awareness       PT Treatment Interventions DME instruction;Balance training;Modalities;Gait training;Neuromuscular re-education;Stair training;Functional mobility training;Therapeutic activities;Therapeutic exercise;Manual techniques;Patient/family education    PT Goals (Current goals can be found in the Care Plan section)  Acute Rehab PT Goals Patient Stated Goal: decreased pain, get better PT Goal Formulation: With patient Time For Goal Achievement: 03/04/23 Potential to Achieve Goals: Good    Frequency BID     Co-evaluation               AM-PAC PT "6 Clicks" Mobility  Outcome Measure Help needed turning from your back to your side while in a flat bed without using bedrails?: None Help needed moving from lying on your  back to sitting on the side of a flat bed without using bedrails?: A Little Help needed moving to and from a bed to a chair (including a wheelchair)?: A Little Help needed standing up from a chair using your arms (e.g., wheelchair or bedside chair)?: A Little Help needed to walk in hospital room?: A Little Help needed climbing 3-5 steps with a railing? : A Little 6 Click Score: 19    End of Session   Activity Tolerance: Patient tolerated treatment well Patient left: in chair;with family/visitor present;with nursing/sitter in room Nurse Communication: Mobility status PT Visit Diagnosis: Pain;Muscle weakness (generalized) (M62.81);Other abnormalities of gait and mobility (R26.89);Difficulty in walking, not elsewhere classified (R26.2) Pain - Right/Left: Left Pain - part of body: Hip    Time: 6578-4696 PT Time Calculation (min) (ACUTE ONLY): 26 min   Charges:   PT Evaluation $PT Eval Low Complexity: 1 Low PT Treatments $Therapeutic Exercise: 8-22 mins PT General Charges $$ ACUTE PT VISIT: 1 Visit         Aleda Grana, PT, DPT 02/18/23, 2:41 PM   Sandi Mariscal 02/18/2023, 2:40 PM

## 2023-02-18 NOTE — Interval H&P Note (Signed)
Patient history and physical updated. Consent reviewed including risks, benefits, and alternatives to surgery. Patient agrees with above plan to proceed with left anterior total hip arthroplasty  

## 2023-02-18 NOTE — Transfer of Care (Signed)
Immediate Anesthesia Transfer of Care Note  Patient: Alice Andrews  Procedure(s) Performed: TOTAL HIP ARTHROPLASTY ANTERIOR APPROACH (Left: Hip)  Patient Location: PACU  Anesthesia Type:Spinal  Level of Consciousness: awake, alert , and oriented  Airway & Oxygen Therapy: Patient Spontanous Breathing and Patient connected to face mask oxygen  Post-op Assessment: Report given to RN and Post -op Vital signs reviewed and stable  Post vital signs: Reviewed and stable  Last Vitals:  Vitals Value Taken Time  BP 112/64 02/18/23 1002  Temp    Pulse 52 02/18/23 1005  Resp 15 02/18/23 1005  SpO2 100 % 02/18/23 1005  Vitals shown include unfiled device data.  Last Pain:  Vitals:   02/18/23 0625  TempSrc: Temporal  PainSc: 10-Worst pain ever         Complications: No notable events documented.

## 2023-02-18 NOTE — Anesthesia Preprocedure Evaluation (Addendum)
Anesthesia Evaluation  Patient identified by MRN, date of birth, ID band Patient awake    Reviewed: Allergy & Precautions, NPO status , Patient's Chart, lab work & pertinent test results  History of Anesthesia Complications Negative for: history of anesthetic complications  Airway Mallampati: III  TM Distance: >3 FB Neck ROM: full    Dental no notable dental hx.    Pulmonary former smoker   Pulmonary exam normal        Cardiovascular negative cardio ROS Normal cardiovascular exam     Neuro/Psych negative neurological ROS  negative psych ROS   GI/Hepatic negative GI ROS, Neg liver ROS,,,  Endo/Other  negative endocrine ROS    Renal/GU negative Renal ROS  negative genitourinary   Musculoskeletal  (+) Arthritis , Osteoarthritis,    Abdominal   Peds  Hematology negative hematology ROS (+)   Anesthesia Other Findings Past Medical History: No date: Anemia No date: Arthritis No date: Avascular necrosis of bone of hip (HCC)     Comment:  bilateral No date: Chronic pain of both knees No date: Former heavy tobacco smoker No date: H/O migraine No date: Osteoarthritis of left hip No date: Pelvic mass No date: Right ovarian cyst No date: Vitamin B12 deficiency No date: Vitamin D deficiency  Past Surgical History: No date: ABDOMINAL HYSTERECTOMY No date: CESAREAN SECTION     Comment:  x4 2000: KIDNEY SURGERY     Comment:  stent placed and then removed.  07/07/2021: KNEE ARTHROSCOPY WITH LATERAL MENISECTOMY; Left     Comment:  Procedure: Left knee arthroscopy with lateral femoral               microfracture, medial femoral chondroplasty;  Surgeon:               Signa Kell, MD;  Location: [facility];                Service: Orthopedics;  Laterality: Left; 04/2022: ROBOTIC RESECTION OF LEFT PELVIC MASS      Comment:  [facility]  BMI    Body Mass Index: 33.33 kg/m      Reproductive/Obstetrics negative  OB ROS                              Anesthesia Physical Anesthesia Plan  ASA: 2  Anesthesia Plan: Spinal   Post-op Pain Management: Regional block*   Induction: Intravenous  PONV Risk Score and Plan: 2 and Propofol infusion and TIVA  Airway Management Planned: Natural Airway and Nasal Cannula  Additional Equipment:   Intra-op Plan:   Post-operative Plan:   Informed Consent: I have reviewed the patients History and Physical, chart, labs and discussed the procedure including the risks, benefits and alternatives for the proposed anesthesia with the patient or authorized representative who has indicated his/her understanding and acceptance.     Dental Advisory Given  Plan Discussed with: Anesthesiologist, CRNA and Surgeon  Anesthesia Plan Comments: (Patient reports no bleeding problems and no anticoagulant use.  Plan for spinal with backup GA  Patient consented for risks of anesthesia including but not limited to:  - adverse reactions to medications - damage to eyes, teeth, lips or other oral mucosa - nerve damage due to positioning  - risk of bleeding, infection and or nerve damage from spinal that could lead to paralysis - risk of headache or failed spinal - damage to teeth, lips or other oral mucosa - sore throat or hoarseness -  damage to heart, brain, nerves, lungs, other parts of body or loss of life  Patient voiced understanding and assent.)        Anesthesia Quick Evaluation

## 2023-02-18 NOTE — Op Note (Signed)
Patient Name: Alice Andrews  ION:[id number]  Pre-Operative Diagnosis: Left hip Osteoarthritis  Post-Operative Diagnosis: (same)  Procedure: Left Total Hip Arthroplasty  Components/Implants: Cup: Trident Tritanium Clusterhole 48/D w/ x2 screws    Liner: neutral x3 poly 36/D  Stem: Insignia #3 std offset  Head: Biolox Ceramic 36 +29mm  Date of Surgery: 02/18/2023  Surgeon: Reinaldo Berber MD  Assistant: Amador Cunas PA (present and scrubbed throughout the case, critical for assistance with exposure, retraction, instrumentation, and closure), Minor PAs   Anesthesiologist: Joelene Millin  Anesthesia: Spinal   EBL: 150cc  IVF:300cc  Complications: None   Brief history: The patient is a 42 year old female with a history of avascular necrosis with collapse of the bilateral hips with pain limiting their range of motion and activities of daily living, which has failed multiple attempts at conservative therapy.  The risks and benefits of left total hip arthroplasty as definitive surgical treatment were discussed with the patient, who opted to proceed with the operation.  After outpatient medical clearance and optimization was completed the patient was admitted to [facility] for the procedure.  All preoperative films were reviewed and an appropriate surgical plan was made prior to surgery.   Description of procedure: The patient was brought to the operating room where laterality was confirmed by all those present to be the left side.  The patient was administered spinal anesthesia on a stretcher prior to being moved supine on the operating room table. Patient was given an intravenous dose of antibiotics for surgical prophylaxis and TXA.  All bony prominences and extremities were well padded and the patient was securely attached to the table boots, a perineal post was placed and the patient had a safety strap placed.  Surgical site was prepped with alcohol and chlorhexidine.  The surgical site over the hip was and draped in typical sterile fashion with multiple layers of adhesive and nonadhesive drapes.  The incision site was marked out with a sterile marker and care was taken to assess the position of the ASIS and ensure appropriate position for the incision.    A surgical timeout was then called with participation of all staff in the room the patient was then a confirmed again and laterality confirmed.  Incision was made over the anterior lateral aspect of the proximal thigh in line with the TFL.  Appropriate retractors were placed and all bleeding vessels were coagulated within the subcutaneous and fatty layers.  An incision was made in the TFL fascia in the interval was carefully identified.  The lateral ascending branches of the circumflex vessels were identified, cauterized and carefully dissected. The main vessels were then tied with a 0 silk hand tie.  Retractors were placed around the superior lateral and inferior medial aspects of the femoral neck and a capsulotomy was performed exposing the hip joint.  Retraction stitches were placed and the capsulotomy to assist with visualization.  Femoral neck cut was then made and the femoral head was extracted after placing the leg in traction.  Bone wax was then applied to the proximal cut surface of the femur and aqua mantis was used to address any bleeding around the femoral neck cut.  Retractors were then placed around the acetabulum to fully visualize the joint space, and the remaining labral tissue was removed and pulvinar was removed.   The acetabulum was then sequentially reamed up to the appropriate size in order to get good fit and fill for the acetabular component while under fluoroscopic guidance.  Acetabular  component was then placed and malleted into a secure fit while confirming position and abduction angle and anteversion utilizing fluoroscopy.  2 screws were then placed in the acetabular cup to assist in securing the  cup in place. The cup was irrigated,  a real neutral liner was placed, impacted, and checked for stability. The femur traction was dropped and sequentially externally rotated while performing a release of the posterior and superomedial tissues off of the proximal femur to allow for mobility, care was taken to preserve the external rotators and piriformis attachments.  The remaining interval between the abductors and the capsule was dissected out and a retractor was placed over the superolateral aspect of the femur over the greater trochanter.  The leg was carefully brought down into extension and adducted to provide visualization of the proximal femur for broaching.  The femur was then sequentially broached up to an appropriate size which provided for good fill and stability to the femoral broach.  A trial neck and head were placed on the femoral broach and the leg was brought up for reduction.  The hip was reduced and manual check of stability was performed.  The hip was found to be stable in flexion internal rotation and extension external rotation.  Leg lengths were confirmed on fluoroscopy.   The hip was then dislocated the trial neck and head were removed.  The leg was then brought down into extension and adduction in the proximal femur was reexposed.  The broach trial was removed and the femur was irrigated with normal saline prior to the real femoral stem being implanted.  After the femoral stem was seated and shown to have good fit and fill the appropriate head was impacted the leg was brought up and reduced.  There was good range of motion with stability in flexion internal rotation and extension external rotation on testing.  Leg lengths were found to be appropriate on fluoroscopic evaluation at this time.  The hip was then irrigated with betdine based surgiphor solution and then saline solution.  The capsulotomy was repaired with Ethibond sutures.  A pericapsular and peritrochanteric cocktail with  Exparel and bupivacaine was then injected as well as the subcutaneous tissues. The fascia was closed with a #1 barbed running suture.  The deep tissues were closed with Vicryl sutures the subcutaneous tissues were closed with interrupted Vicryl sutures and a running barbed 4-0 suture.  The skin was then reinforced with Dermabond and a sterile dressing was placed.   The patient was awoken from anesthesia transferred off of the operating room table onto a hospital bed where examination of leg lengths found the leg lengths to be equal with a good distal pulse.  The patient was then transferred to the PACU in stable condition.

## 2023-02-18 NOTE — H&P (Signed)
History of Present Illness: Alice Andrews is an 42 y.o. female who presents for history and physical for left anterior total hip arthroplasty with Dr. Audelia Acton on 02/18/2023. She has had at least 7 years of pain in her left greater than right hip. Her pain radiates down her thigh from the groin. She has pain in her knees. She describes a sharp and aching pain that is 10 out of 10 and worse with standing or walking. She has been very limited over the years with activities involving hip flexion such as putting on socks and shoes. She denies any recent falls trauma or injury. She has x-rays from August 2024 show an severe bilateral degenerative changes of the left greater than right hip with collapse femoral head. X-rays consistent with collapsed avascular necrosis. She has seen Dr. Audelia Acton discussed total hip arthroplasty, risk benefits and complications and has agreed to consent of the procedure with Dr. Audelia Acton  The patient has no history of any clotting disorders or any family history of clotting disorders, she has no steroid exposure in the last 2 years, she does not drink alcohol, no history of sickle cell anemia in the family.  Patient is a non-smoker with a BMI of 32.85 is a nondiabetic with a last A1c of 5.1  Past Medical History: Past Medical History:  Diagnosis Date  Abdominal pain, generalized 02/08/2022  Acute blood loss anemia 06/27/2016  Chronic pain 2017  Has followed with ortho - started as L leg but now both legs.  Insomnia 12/02/2018  Macrocytic anemia 08/30/2014  [x]  Ferritin, TIBC, iron, hgb electrophoresis, B12, folate, smear sent 2/8 - All normal except ferritin (8.5) [ ]  Ensure taking iron, started 2/8 2017-2018 pregnancy: 04/08/16: Hct 35.1, Hgb 11.7, MCV 100.8. 04/19/16: Hct 32.2, Hgb 10.6, MCV 103.6. Mild anemia with elevated MCV. 2016 pregnancy: 08/26/14: Hct: 32.2, Hgb: 10.9, MCV: 101.5. Mild anemia with elevated MCV. 5/19/6 Folate: 8.8, vitam  Migraines  Pelvic  fluid collection 02/08/2022  Pelvic mass in female 03/26/2022   Past Surgical History: Past Surgical History:  Procedure Laterality Date  Left knee arthroscopic lateral femoral condyle microfracture, left knee arthroscopic chondroplasty od medical femoral condyle Left 07/07/2021  Dr. Allena Katz  CESAREAN SECTION  CESAREAN SECTION  CESAREAN SECTION  CESAREAN SECTION   Past Family History: Family History  Problem Relation Age of Onset  Stroke Mother  Lupus Mother  Diabetes type I Mother  Colon cancer Father  No Known Problems Brother  Cancer Maternal Grandmother  No Known Problems Maternal Grandfather  Cancer Paternal Grandmother  Cancer Paternal Grandfather  No Known Problems Brother   Medications: Current Outpatient Medications  Medication Sig Dispense Refill  acetaminophen (TYLENOL) 325 MG tablet Take by mouth as needed  cholecalciferol (VITAMIN D3) 5,000 unit capsule Take 1 capsule (5,000 Units total) by mouth once daily for Vitamin D Deficiency. 360 capsule 11   No current facility-administered medications for this visit.   Allergies: No Known Allergies   Visit Vitals: Vitals:  02/05/23 1014  BP: 118/76    Review of Systems:  A comprehensive 14 point ROS was performed, reviewed, and the pertinent orthopaedic findings are documented in the HPI.  Physical Exam: Body mass index is 33.01 kg/m. General:  Well developed, well nourished, no apparent distress, normal affect, antalgic gait with no assistive device.  HEENT: Head normocephalic, atraumatic, PERRL.   Abdomen: Soft, non tender, non distended, Bowel sounds present.  Heart: Examination of the heart reveals regular, rate, and rhythm. There is no murmur  noted on ascultation. There is a normal apical pulse.  Lungs: Lungs are clear to auscultation. There is no wheeze, rhonchi, or crackles. There is normal expansion of bilateral chest walls.  Bilateral hip exam  SKIN: intact SWELLING: none WARMTH: no  warmth TENDERNESS: none, Stinchfield Positive on both sides ROM: 10 degrees of internal rotation and 30 degrees of external rotation bilaterally pain in the anterior thigh with internal rotation and flexion of both legs more reproducible on the right; Hip Flexion 90 STRENGTH: normal GAIT: antalgic and stiff-legged STABILITY: stable to testing CREPITUS: no LEG LENGTH DISCREPANCY: none NEUROLOGICAL EXAM: Sensation intact mildly subjective decrease sensation on the plantar aspect of both feet VASCULAR EXAM: normal LUMBAR SPINE: tenderness: no straight leg raising sign: no motor exam: normal  Hip Imaging :  AP pelvis and bilateral hip frog-leg lateral x-rays images reviewed by myself performed on 11/08/2022. There are significant degenerative changes to both hips with joint space narrowing and collapse of the superior heads of both femurs with joint space narrowing osteophyte formation and subchondral cyst formation. This is consistent with a collapsed avascular necrosis in both femoral heads. Slightly worse on the right side.  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 She has collapse of the femoral head. Pain has been increasing over the last 7 years. Pain interferes with her quality of life and activities daily living. She has a hard and difficult time performing activities involving hip flexion as well as walking. Pain is severe 10 out of 10. Risks, benefits, complications of a left direct anterior total hip arthroplasty have been discussed with the patient. Patient has agreed and consented to the procedure with Dr. Audelia Acton on 02/18/2023.  The hospitalization and post-operative care and rehabilitation were also discussed. The use of perioperative antibiotics and DVT prophylaxis were discussed. The risk, benefits and alternatives to a surgical intervention were discussed at length with the  patient. The patient was also advised of risks related to the medical comorbidities and elevated body mass index (BMI). A lengthy discussion took place to review the most common complications including but not limited to: deep vein thrombosis, pulmonary embolus, heart attack, stroke, infection, wound breakdown, heterotopic ossification, dislocation, numbness, leg length in-equality, intraoperative fracture, damage to nerves, tendon,muscles, arteries or other blood vessels, death and other possible complications from anesthesia. The patient was told that we will take steps to minimize these risks by using sterile technique, antibiotics and DVT prophylaxis when appropriate and follow the patient postoperatively in the office setting to monitor progress. The possibility of recurrent pain, no improvement in pain and actual worsening of pain were also discussed with the patient. The risk of dislocation following total hip replacement was discussed and potential precautions to prevent dislocation were reviewed. We do specific conversation about her young age and increased risk for need of revision during her life.   We discussed that with the future plan to do both total hip replacements that we will plan to add a small amount of length to the left leg today in preparation for future right total hip replacement. The patient agrees with this plan. She agrees with plan to proceed with left total hip arthroplasty today.

## 2023-02-18 NOTE — Anesthesia Procedure Notes (Signed)
Spinal  Patient location during procedure: OR Start time: 02/18/2023 7:40 AM End time: 02/18/2023 7:44 AM Reason for block: surgical anesthesia Staffing Performed: resident/CRNA  Anesthesiologist: Louie Boston, MD Resident/CRNA: Irving Burton, CRNA Performed by: Irving Burton, CRNA Authorized by: Louie Boston, MD   Preanesthetic Checklist Completed: patient identified, IV checked, site marked, risks and benefits discussed, surgical consent, monitors and equipment checked, pre-op evaluation and timeout performed Spinal Block Patient position: sitting Prep: DuraPrep Patient monitoring: heart rate, cardiac monitor, continuous pulse ox and blood pressure Approach: midline Location: L3-4 Injection technique: single-shot Needle Needle type: Pencan and Introducer  Needle gauge: 25 G Needle length: 9 cm Assessment Sensory level: T4 Events: CSF return

## 2023-02-19 DIAGNOSIS — M1612 Unilateral primary osteoarthritis, left hip: Secondary | ICD-10-CM | POA: Diagnosis not present

## 2023-02-19 LAB — BASIC METABOLIC PANEL
Anion gap: 5 (ref 5–15)
BUN: 10 mg/dL (ref 6–20)
CO2: 25 mmol/L (ref 22–32)
Calcium: 8.2 mg/dL — ABNORMAL LOW (ref 8.9–10.3)
Chloride: 104 mmol/L (ref 98–111)
Creatinine, Ser: 0.71 mg/dL (ref 0.44–1.00)
GFR, Estimated: >60 mL/min (ref >60)
Glucose, Bld: 111 mg/dL — ABNORMAL HIGH (ref 70–99)
Potassium: 3.3 mmol/L — ABNORMAL LOW (ref 3.5–5.1)
Sodium: 134 mmol/L — ABNORMAL LOW (ref 135–145)

## 2023-02-19 LAB — CBC
HCT: 28.4 % — ABNORMAL LOW (ref 36.0–46.0)
Hemoglobin: 10.2 g/dL — ABNORMAL LOW (ref 12.0–15.0)
MCH: 34.2 pg — ABNORMAL HIGH (ref 26.0–34.0)
MCHC: 35.9 g/dL (ref 30.0–36.0)
MCV: 95.3 fL (ref 80.0–100.0)
Platelets: 234 10*3/uL (ref 150–400)
RBC: 2.98 MIL/uL — ABNORMAL LOW (ref 3.87–5.11)
RDW: 11.7 % (ref 11.5–15.5)
WBC: 10.7 10*3/uL — ABNORMAL HIGH (ref 4.0–10.5)
nRBC: 0 % (ref 0.0–0.2)

## 2023-02-19 MED ORDER — ONDANSETRON HCL 4 MG PO TABS: 4.0000 mg | ORAL_TABLET | Freq: Four times a day (QID) | ORAL | 0 refills | Status: DC | PRN

## 2023-02-19 MED ORDER — ACETAMINOPHEN 500 MG PO TABS
ORAL_TABLET | ORAL | Status: AC
  Filled 2023-02-19: qty 2

## 2023-02-19 MED ORDER — OXYCODONE HCL 5 MG PO TABS: 5.0000 mg | ORAL_TABLET | Freq: Four times a day (QID) | ORAL | 0 refills | Status: DC | PRN

## 2023-02-19 MED ORDER — ENOXAPARIN SODIUM 40 MG/0.4ML IJ SOSY: 40.0000 mg | PREFILLED_SYRINGE | INTRAMUSCULAR | 0 refills | Status: DC

## 2023-02-19 MED ORDER — MORPHINE SULFATE (PF) 4 MG/ML IV SOLN
INTRAVENOUS | Status: AC
  Filled 2023-02-19: qty 1

## 2023-02-19 MED ORDER — POLYETHYLENE GLYCOL 3350 17 G PO PACK: 17.0000 g | PACK | Freq: Every day | ORAL | 0 refills | Status: DC

## 2023-02-19 MED ORDER — METHOCARBAMOL 500 MG PO TABS: 500.0000 mg | ORAL_TABLET | Freq: Three times a day (TID) | ORAL | 0 refills | Status: DC | PRN

## 2023-02-19 MED ORDER — TRAMADOL HCL 50 MG PO TABS: 50.0000 mg | ORAL_TABLET | Freq: Four times a day (QID) | ORAL | 0 refills | Status: DC | PRN

## 2023-02-19 MED ORDER — DOCUSATE SODIUM 100 MG PO CAPS: 100.0000 mg | ORAL_CAPSULE | Freq: Two times a day (BID) | ORAL | 0 refills | Status: DC

## 2023-02-19 MED ORDER — TRAMADOL HCL 50 MG PO TABS
ORAL_TABLET | ORAL | Status: AC
  Filled 2023-02-19: qty 1

## 2023-02-19 MED ORDER — KETOROLAC TROMETHAMINE 15 MG/ML IJ SOLN
INTRAMUSCULAR | Status: AC
  Filled 2023-02-19: qty 1

## 2023-02-19 MED ORDER — ENOXAPARIN SODIUM 40 MG/0.4ML IJ SOSY
PREFILLED_SYRINGE | INTRAMUSCULAR | Status: AC
  Filled 2023-02-19: qty 0.4

## 2023-02-19 MED ORDER — PANTOPRAZOLE SODIUM 40 MG PO TBEC
DELAYED_RELEASE_TABLET | ORAL | Status: AC
Start: 2023-02-19 — End: ?
  Filled 2023-02-19: qty 1

## 2023-02-19 MED ORDER — CELECOXIB 200 MG PO CAPS: 200.0000 mg | ORAL_CAPSULE | Freq: Two times a day (BID) | ORAL | 0 refills | Status: AC

## 2023-02-19 MED ORDER — CELECOXIB 200 MG PO CAPS: 200.0000 mg | ORAL_CAPSULE | Freq: Two times a day (BID) | ORAL | 0 refills | Status: DC

## 2023-02-19 MED ORDER — POTASSIUM CHLORIDE 20 MEQ PO PACK
20.0000 meq | PACK | Freq: Two times a day (BID) | ORAL | Status: DC
  Administered 2023-02-19: 20 meq via ORAL
  Filled 2023-02-19: qty 1

## 2023-02-19 MED ORDER — ACETAMINOPHEN 500 MG PO TABS: 1000.0000 mg | ORAL_TABLET | Freq: Three times a day (TID) | ORAL | 0 refills | Status: DC

## 2023-02-19 MED ORDER — DOCUSATE SODIUM 100 MG PO CAPS
ORAL_CAPSULE | ORAL | Status: AC
  Filled 2023-02-19: qty 1

## 2023-02-19 NOTE — Progress Notes (Signed)
Patient is not able to walk the distance required to go the bathroom, or she is unable to safely negotiate stairs required to access the bathroom.  A 3in1 BSC will alleviate this problem.       T. Chris Rosalena Mccorry, PA-C [facility] 

## 2023-02-19 NOTE — Plan of Care (Signed)
Patient progressing 

## 2023-02-19 NOTE — Progress Notes (Signed)
   Subjective: 1 Day Post-Op Procedure(s) (LRB): TOTAL HIP ARTHROPLASTY ANTERIOR APPROACH (Left) Patient reports pain as mild.   Patient is well, and has had no acute complaints or problems Denies any CP, SOB, ABD pain. We will continue therapy today.  Plan is to go Home after hospital stay.  Objective: Vital signs in last 24 hours: Temp:  [97.6 F (36.4 C)-98.9 F (37.2 C)] 97.9 F (36.6 C) (11/12 0429) Pulse Rate:  [49-78] 60 (11/12 0013) Resp:  [9-31] 16 (11/12 0429) BP: (104-122)/(55-73) 114/68 (11/12 0429) SpO2:  [98 %-100 %] 98 % (11/12 0429)  Intake/Output from previous day: 11/11 0701 - 11/12 0700 In: 497.2 [IV Piggyback:497.2] Out: 470 [Urine:320; Blood:150] Intake/Output this shift: No intake/output data recorded.  Recent Labs    02/19/23 0529  HGB 10.2*   Recent Labs    02/19/23 0529  WBC 10.7*  RBC 2.98*  HCT 28.4*  PLT 234   Recent Labs    02/19/23 0529  NA 134*  K 3.3*  CL 104  CO2 25  BUN 10  CREATININE 0.71  GLUCOSE 111*  CALCIUM 8.2*   No results for input(s): "LABPT", "INR" in the last 72 hours.  EXAM General - Patient is Alert, Appropriate, and Oriented Extremity - Neurovascular intact Sensation intact distally Intact pulses distally Dorsiflexion/Plantar flexion intact No cellulitis present Compartment soft Dressing - dressing C/D/I and no drainage Motor Function - intact, moving foot and toes well on exam.   Past Medical History:  Diagnosis Date   Anemia    Arthritis    Avascular necrosis of bone of hip (HCC)    bilateral   Chronic pain of both knees    Former heavy tobacco smoker    H/O migraine    Osteoarthritis of left hip    Pelvic mass    Right ovarian cyst    Vitamin B12 deficiency    Vitamin D deficiency     Assessment/Plan:   1 Day Post-Op Procedure(s) (LRB): TOTAL HIP ARTHROPLASTY ANTERIOR APPROACH (Left) Principal Problem:   S/P total left hip arthroplasty  Estimated body mass index is 33.33 kg/m as  calculated from the following:   Height as of this encounter: 4\' 11"  (1.499 m).   Weight as of this encounter: 74.8 kg. Advance diet Up with therapy Pain well-controlled Vital signs are stable Hypokalemia -potassium 3.3.  Supplement with oral K Hemoglobin 10.2, stable Care management to assist with discharge to home with home health PT today pending safe completion of PT goals.  DVT Prophylaxis - Lovenox, TED hose, and SCDs Weight-Bearing as tolerated to left leg   T. Cranston Neighbor, PA-C [facility] 02/19/2023, 7:18 AM

## 2023-02-19 NOTE — Anesthesia Postprocedure Evaluation (Signed)
Anesthesia Post Note  Patient: Alice Andrews  Procedure(s) Performed: TOTAL HIP ARTHROPLASTY ANTERIOR APPROACH (Left: Hip)  Patient location during evaluation: Nursing Unit Anesthesia Type: Spinal Level of consciousness: awake Pain management: pain level controlled Respiratory status: spontaneous breathing Cardiovascular status: stable Postop Assessment: no headache Anesthetic complications: no   No notable events documented.   Last Vitals:  Vitals:   02/19/23 0013 02/19/23 0429  BP: 111/65 114/68  Pulse: 60   Resp: 18 16  Temp:  36.6 C  SpO2: 99% 98%    Last Pain:  Vitals:   02/19/23 0650  TempSrc:   PainSc: Asleep                 Jaye Beagle

## 2023-02-19 NOTE — Discharge Instructions (Signed)
Instructions after Anterior Total Hip Replacement        Dr. Serita Butcher., M.D.      Dept. of [facility]  [address] [place], [place]  [address]  Phone: [phone]   Fax: [phone]    DIET: Drink plenty of non-alcoholic fluids. Resume your normal diet. Include foods high in fiber.  ACTIVITY:  You may use crutches or a walker with weight-bearing as tolerated, unless instructed otherwise. You may be weaned off of the walker or crutches by your Physical Therapist.  Continue doing gentle exercises. Exercising will reduce the pain and swelling, increase motion, and prevent muscle weakness.   Please continue to use the TED compression stockings for 2 weeks. You may remove the stockings at night, but should reapply them in the morning. Do not drive or operate any equipment until instructed.  WOUND CARE:  Continue to use ice packs periodically to reduce pain and swelling. You may shower with honeycomb dressing 3 days after your surgery. Do not submerge incision site under water. Remove honeycomb dressing 7 days after surgery and allow dermabond to fall off on its own.   MEDICATIONS: You may resume your regular medications. Please take the pain medication as prescribed on the medication list. Do not take pain medication on an empty stomach. You have been given a prescription for a blood thinner to prevent blood clots. Please take the medication as instructed. (NOTE: After completing a 2 week course of Lovenox, take one Enteric-coated 81 mg aspirin twice a day for 3 additional weeks.) Pain medications and iron supplements can cause constipation. Use a stool softener (Senokot or Colace) on a daily basis and a laxative (dulcolax or miralax) as needed. Do not drive or drink alcoholic beverages when taking pain medications.  POSTOPERATIVE CONSTIPATION PROTOCOL Constipation - defined medically as fewer than three stools per week and  severe constipation as less than one stool per week.  One of the most common issues patients have following surgery is constipation.  Even if you have a regular bowel pattern at home, your normal regimen is likely to be disrupted due to multiple reasons following surgery.  Combination of anesthesia, postoperative narcotics, change in appetite and fluid intake all can affect your bowels.  In order to avoid complications following surgery, here are some recommendations in order to help you during your recovery period.  Colace (docusate) - Pick up an over-the-counter form of Colace or another stool softener and take twice a day as long as you are requiring postoperative pain medications.  Take with a full glass of water daily.  If you experience loose stools or diarrhea, hold the colace until you stool forms back up.  If your symptoms do not get better within 1 week or if they get worse, check with your doctor.  Dulcolax (bisacodyl) - Pick up over-the-counter and take as directed by the product packaging as needed to assist with the movement of your bowels.  Take with a full glass of water.  Use this product as needed if not relieved by Colace only.   MiraLax (polyethylene glycol) - Pick up over-the-counter to have on hand.  MiraLax is a solution that will increase the amount of water in your bowels to assist with bowel movements.  Take as directed and can mix with a glass of water, juice, soda, coffee, or tea.  Take if you go more than two days without a movement. Do not use MiraLax more than  once per day. Call your doctor if you are still constipated or irregular after using this medication for 7 days in a row.  If you continue to have problems with postoperative constipation, please contact the office for further assistance and recommendations.  If you experience "the worst abdominal pain ever" or develop nausea or vomiting, please contact the office immediatly for further recommendations for  treatment.   CALL THE OFFICE FOR: Temperature above 101 degrees Excessive bleeding or drainage on the dressing. Excessive swelling, coldness, or paleness of the toes. Persistent nausea and vomiting.  FOLLOW-UP:  You should have an appointment to return to the office in 2 weeks after surgery. Arrangements have been made for continuation of Physical Therapy (either home therapy or outpatient therapy).

## 2023-02-19 NOTE — Discharge Summary (Signed)
Physician Discharge Summary  Patient ID: Alice Andrews MRN: [id number] DOB/AGE: Apr 26, 1980 42 y.o.  Admit date: 02/18/2023 Discharge date: 02/19/2023  Admission Diagnoses:  S/P total left hip arthroplasty [X52.841]   Discharge Diagnoses: Patient Active Problem List   Diagnosis Date Noted   S/P total left hip arthroplasty 02/18/2023   Bilateral primary osteoarthritis of knee 11/29/2020   Primary osteoarthritis of right knee 11/29/2020   Primary osteoarthritis of left knee 11/29/2020   Chronic pain of both knees 11/29/2020    Past Medical History:  Diagnosis Date   Anemia    Arthritis    Avascular necrosis of bone of hip (HCC)    bilateral   Chronic pain of both knees    Former heavy tobacco smoker    H/O migraine    Osteoarthritis of left hip    Pelvic mass    Right ovarian cyst    Vitamin B12 deficiency    Vitamin D deficiency      Transfusion: none   Consultants (if any):   Discharged Condition: Improved  Hospital Course: Alice Andrews is an 42 y.o. female who was admitted 02/18/2023 with a diagnosis of S/P total left hip arthroplasty and went to the operating room on 02/18/2023 and underwent the above named procedures.    Surgeries: Procedure(s): TOTAL HIP ARTHROPLASTY ANTERIOR APPROACH on 02/18/2023 Patient tolerated the surgery well. Taken to PACU where she was stabilized and then transferred to the orthopedic floor.  Started on Lovenox 40 mg q 24 hrs. TEDs and SCDs applied bilaterally. Heels elevated on bed. No evidence of DVT. Negative Homan. Physical therapy started on day #1 for gait training and transfer. OT started day #1 for ADL and assisted devices.  Patient's IV was d/c on day #1. Patient was able to safely and independently complete all PT goals. PT recommending discharge to home.    On post op day #1 patient was stable and ready for discharge to home with HHPT.  Implants: Cup: Trident Tritanium Clusterhole 48/D w/ x2 screws     Liner: neutral x3 poly 36/D  Stem: Insignia #3 std offset  Head: Biolox Ceramic 36 +70mm    She was given perioperative antibiotics:  Anti-infectives (From admission, onward)    Start     Dose/Rate Route Frequency Ordered Stop   02/18/23 1400  ceFAZolin (ANCEF) IVPB 2g/100 mL premix        2 g 200 mL/hr over 30 Minutes Intravenous Every 6 hours 02/18/23 1116 02/18/23 2056   02/18/23 0600  ceFAZolin (ANCEF) IVPB 2g/100 mL premix        2 g 200 mL/hr over 30 Minutes Intravenous On call to O.R. 02/17/23 2252 02/18/23 3244     .  She was given sequential compression devices, early ambulation, and lovenox TEDs for DVT prophylaxis.  She benefited maximally from the hospital stay and there were no complications.    Recent vital signs:  Vitals:   02/19/23 0429 02/19/23 0700  BP: 114/68 (!) 98/44  Pulse:  (!) 56  Resp: 16 16  Temp: 97.9 F (36.6 C) 97.8 F (36.6 C)  SpO2: 98% 100%    Recent laboratory studies:  Lab Results  Component Value Date   HGB 10.2 (L) 02/19/2023   HGB 12.9 02/06/2023   HGB 13.4 08/20/2019   Lab Results  Component Value Date   WBC 10.7 (H) 02/19/2023   PLT 234 02/19/2023   Lab Results  Component Value Date   INR 1.0 08/22/2012   Lab  Results  Component Value Date   NA 134 (L) 02/19/2023   K 3.3 (L) 02/19/2023   CL 104 02/19/2023   CO2 25 02/19/2023   BUN 10 02/19/2023   CREATININE 0.71 02/19/2023   GLUCOSE 111 (H) 02/19/2023    Discharge Medications:   Allergies as of 02/19/2023   No Known Allergies      Medication List     STOP taking these medications    ibuprofen 200 MG tablet Commonly known as: ADVIL       TAKE these medications    acetaminophen 500 MG tablet Commonly known as: TYLENOL Take 2 tablets (1,000 mg total) by mouth every 8 (eight) hours. What changed:  how much to take when to take this reasons to take this   celecoxib 200 MG capsule Commonly known as: CeleBREX Take 1 capsule (200 mg total) by mouth 2  (two) times daily for 14 days.   docusate sodium 100 MG capsule Commonly known as: COLACE Take 1 capsule (100 mg total) by mouth 2 (two) times daily.   enoxaparin 40 MG/0.4ML injection Commonly known as: LOVENOX Inject 0.4 mLs (40 mg total) into the skin daily for 14 days.   methocarbamol 500 MG tablet Commonly known as: ROBAXIN Take 1 tablet (500 mg total) by mouth every 8 (eight) hours as needed for muscle spasms.   ondansetron 4 MG tablet Commonly known as: ZOFRAN Take 1 tablet (4 mg total) by mouth every 6 (six) hours as needed for nausea.   oxyCODONE 5 MG immediate release tablet Commonly known as: Oxy IR/ROXICODONE Take 1 tablet (5 mg total) by mouth every 6 (six) hours as needed for moderate pain (pain score 4-6) (give for moderate pain not relieved by tramadol).   polyethylene glycol 17 g packet Commonly known as: MiraLax Take 17 g by mouth daily.   traMADol 50 MG tablet Commonly known as: ULTRAM Take 1 tablet (50 mg total) by mouth every 6 (six) hours as needed for moderate pain (pain score 4-6).   Vitamin D3 Ultra Strength 125 MCG (5000 UT) capsule Generic drug: Cholecalciferol Take 5,000 Units by mouth daily.               Durable Medical Equipment  (From admission, onward)           Start     Ordered   02/18/23 1221  For home use only DME 3 n 1  Once        02/18/23 1220   02/18/23 1220  For home use only DME Walker rolling  Once       Question Answer Comment  Walker: With 5 Inch Wheels   Patient needs a walker to treat with the following condition S/P hip replacement      02/18/23 1220            Diagnostic Studies: DG HIP UNILAT WITH PELVIS 1V LEFT  Result Date: 02/18/2023 CLINICAL DATA:  Status post total left hip arthroplasty. EXAM: DG HIP (WITH OR WITHOUT PELVIS) 1V*L* COMPARISON:  CT abdomen and pelvis 06/06/2013 FINDINGS: Images were performed intraoperatively without the presence of a radiologist. Interval total left hip  arthroplasty. No hardware complication is seen. Total fluoroscopy images: 3 Total fluoroscopy time: 15 seconds Total dose: Radiation Exposure Index (as provided by the fluoroscopic device): 1.6 mGy air Kerma Please see intraoperative findings for further detail. IMPRESSION: Intraoperative fluoroscopy for total left hip arthroplasty. Electronically Signed   By: Neita Garnet M.D.   On: 02/18/2023 12:09  DG C-Arm 1-60 Min-No Report  Result Date: 02/18/2023 Fluoroscopy was utilized by the requesting physician.  No radiographic interpretation.    Disposition:      Follow-up Information     Evon Slack, PA-C Follow up in 2 week(s).   Specialties: Orthopedic Surgery, Emergency Medicine Contact information: [address] [place] [address] [phone]                  Signed: Patience Musca 02/19/2023, 7:23 AM

## 2023-02-19 NOTE — Progress Notes (Signed)
Nsg Discharge Note  Admit Date:  02/18/2023 Discharge date: 02/19/2023   Alice Andrews to be D/C'd Home per MD order.  AVS completed.  Patient/caregiver able to verbalize understanding.  Discharge Medication: Allergies as of 02/19/2023   No Known Allergies      Medication List     STOP taking these medications    ibuprofen 200 MG tablet Commonly known as: ADVIL       TAKE these medications    acetaminophen 500 MG tablet Commonly known as: TYLENOL Take 2 tablets (1,000 mg total) by mouth every 8 (eight) hours. What changed:  how much to take when to take this reasons to take this   celecoxib 200 MG capsule Commonly known as: CeleBREX Take 1 capsule (200 mg total) by mouth 2 (two) times daily for 14 days.   docusate sodium 100 MG capsule Commonly known as: COLACE Take 1 capsule (100 mg total) by mouth 2 (two) times daily.   enoxaparin 40 MG/0.4ML injection Commonly known as: LOVENOX Inject 0.4 mLs (40 mg total) into the skin daily for 14 days.   methocarbamol 500 MG tablet Commonly known as: ROBAXIN Take 1 tablet (500 mg total) by mouth every 8 (eight) hours as needed for muscle spasms.   ondansetron 4 MG tablet Commonly known as: ZOFRAN Take 1 tablet (4 mg total) by mouth every 6 (six) hours as needed for nausea.   oxyCODONE 5 MG immediate release tablet Commonly known as: Oxy IR/ROXICODONE Take 1 tablet (5 mg total) by mouth every 6 (six) hours as needed for moderate pain (pain score 4-6) (give for moderate pain not relieved by tramadol).   polyethylene glycol 17 g packet Commonly known as: MiraLax Take 17 g by mouth daily.   traMADol 50 MG tablet Commonly known as: ULTRAM Take 1 tablet (50 mg total) by mouth every 6 (six) hours as needed for moderate pain (pain score 4-6).   Vitamin D3 Ultra Strength 125 MCG (5000 UT) capsule Generic drug: Cholecalciferol Take 5,000 Units by mouth daily.               Durable Medical Equipment   (From admission, onward)           Start     Ordered   02/19/23 0725  For home use only DME 3 n 1  Once        02/19/23 0724   02/19/23 0725  For home use only DME Walker rolling  Once       Question Answer Comment  Walker: With 5 Inch Wheels   Patient needs a walker to treat with the following condition S/P hip replacement      02/19/23 0724            Discharge Assessment: Vitals:   02/19/23 0733 02/19/23 1101  BP: (!) 98/43 (!) 96/52  Pulse:  (!) 59  Resp:  18  Temp:    SpO2:  100%   Skin clean, dry and intact without evidence of skin break down, no evidence of skin tears noted. IV catheter discontinued intact. Site without signs and symptoms of complications - no redness or edema noted at insertion site, patient denies c/o pain - only slight tenderness at site.  Dressing with slight pressure applied.  D/c Instructions-Education: Discharge instructions given to patient/family with verbalized understanding. D/c education completed with patient/family including follow up instructions, medication list, d/c activities limitations if indicated, with other d/c instructions as indicated by MD - patient able to verbalize  understanding, all questions fully answered. Patient instructed to return to ED, call 911, or call MD for any changes in condition.  Patient escorted via WC, and D/C home via private auto.  Theodore Demark, RN 02/19/2023 11:32 AM

## 2023-02-19 NOTE — Progress Notes (Signed)
Physical Therapy Treatment Patient Details Name: Alice Andrews MRN: [id number] DOB: 1981-03-08 Today's Date: 02/19/2023   History of Present Illness Pt is a 42 y/o F admitted on 02/18/23 for L THA, direct anterior approach. PMH: chronic pain, insomnia    PT Comments  Pt seen for PT tx with pt agreeable, reporting pain has improved compared to yesterday. Pt is able to ambulate increased distances with RW & mod I & negotiate 4 steps with R rail & supervision fade to mod I. Pt is even able to progress to short distance gait without AD with min assist. Pt is progressing very well & anticipating d/c home today.    If plan is discharge home, recommend the following: A little help with walking and/or transfers;A little help with bathing/dressing/bathroom;Assistance with cooking/housework;Assist for transportation;Help with stairs or ramp for entrance   Can travel by private vehicle        Equipment Recommendations  Rolling walker (2 wheels);BSC/3in1    Recommendations for Other Services       Precautions / Restrictions Precautions Precautions: Fall Restrictions Weight Bearing Restrictions: Yes LLE Weight Bearing: Weight bearing as tolerated     Mobility  Bed Mobility Overal bed mobility: Needs Assistance Bed Mobility: Supine to Sit     Supine to sit: Modified independent (Device/Increase time)          Transfers Overall transfer level: Needs assistance Equipment used: Rolling walker (2 wheels) Transfers: Sit to/from Stand Sit to Stand: Supervision           General transfer comment: STS from EOB with cuing for hand placement to push to standing    Ambulation/Gait Ambulation/Gait assistance: Modified independent (Device/Increase time) Gait Distance (Feet): 125 Feet (+ 125 ft) Assistive device: Rolling walker (2 wheels) Gait Pattern/deviations: Decreased stride length Gait velocity: decreased     General Gait Details: Pt ambulates room<>stairs with RW &  mod I. Pt ambulates ~10 ft without AD with min assist without LLE buckling, slightly decreased weight shifting to LLE during stance phase.   Stairs Stairs: Yes Stairs assistance: Supervision, Modified independent (Device/Increase time) Stair Management: One rail Right, Step to pattern Number of Stairs: 4 (6") General stair comments: Pt negotiates 4 steps with R rail with supervision progressing to mod I with good demo of compensatory pattern.   Wheelchair Mobility     Tilt Bed    Modified Rankin (Stroke Patients Only)       Balance Overall balance assessment: Needs assistance Sitting-balance support: Feet supported Sitting balance-Leahy Scale: Good     Standing balance support: During functional activity, No upper extremity supported Standing balance-Leahy Scale: Fair                              Cognition Arousal: Alert Behavior During Therapy: WFL for tasks assessed/performed Overall Cognitive Status: Within Functional Limits for tasks assessed                                          Exercises      General Comments        Pertinent Vitals/Pain Pain Assessment Pain Assessment: 0-10 Pain Score: 6  Pain Location: L hip Pain Descriptors / Indicators: Discomfort Pain Intervention(s): Monitored during session    Home Living  Prior Function            PT Goals (current goals can now be found in the care plan section) Acute Rehab PT Goals Patient Stated Goal: decreased pain, get better PT Goal Formulation: With patient Time For Goal Achievement: 03/04/23 Potential to Achieve Goals: Good Progress towards PT goals: Progressing toward goals    Frequency    BID      PT Plan      Co-evaluation              AM-PAC PT "6 Clicks" Mobility   Outcome Measure  Help needed turning from your back to your side while in a flat bed without using bedrails?: None Help needed moving from  lying on your back to sitting on the side of a flat bed without using bedrails?: None Help needed moving to and from a bed to a chair (including a wheelchair)?: None Help needed standing up from a chair using your arms (e.g., wheelchair or bedside chair)?: None Help needed to walk in hospital room?: None Help needed climbing 3-5 steps with a railing? : A Little 6 Click Score: 23    End of Session   Activity Tolerance: Patient tolerated treatment well Patient left: in bed;with call bell/phone within reach;with family/visitor present Nurse Communication: Mobility status PT Visit Diagnosis: Other abnormalities of gait and mobility (R26.89);Muscle weakness (generalized) (M62.81);Pain Pain - Right/Left: Left Pain - part of body: Hip     Time: 4098-1191 PT Time Calculation (min) (ACUTE ONLY): 8 min  Charges:    $Therapeutic Activity: 8-22 mins PT General Charges $$ ACUTE PT VISIT: 1 Visit                     Aleda Grana, PT, DPT 02/19/23, 11:20 AM   Sandi Mariscal 02/19/2023, 11:19 AM

## 2023-02-20 LAB — SURGICAL PATHOLOGY

## 2023-05-17 ENCOUNTER — Other Ambulatory Visit: Payer: Self-pay | Admitting: Orthopedic Surgery

## 2023-05-27 ENCOUNTER — Other Ambulatory Visit: Payer: Self-pay

## 2023-05-27 ENCOUNTER — Encounter
Admission: RE | Admit: 2023-05-27 | Discharge: 2023-05-27 | Disposition: A | Discharge status: Home / Self Care | Payer: BC Managed Care – PPO | Source: Ambulatory Visit | Attending: Orthopedic Surgery | Admitting: Orthopedic Surgery

## 2023-05-27 DIAGNOSIS — M87052 Idiopathic aseptic necrosis of left femur: Secondary | ICD-10-CM

## 2023-05-27 DIAGNOSIS — Z01812 Encounter for preprocedural laboratory examination: Secondary | ICD-10-CM

## 2023-05-27 HISTORY — DX: Headache, unspecified: R51.9

## 2023-05-27 HISTORY — DX: Pain in unspecified hip: M25.559

## 2023-05-27 HISTORY — DX: Insomnia, unspecified: G47.00

## 2023-05-27 NOTE — Patient Instructions (Addendum)
 Your procedure is scheduled on:   Monday, 06/03/2023  Report to the Registration Desk on the 1st floor of [facility]. To find out your arrival time, please call [phone] between 1PM - 3PM on: Friday 05/31/2023 If your arrival time is 6:00 am, do not arrive before that time as the [facility] entrance doors do not open until 6:00 am.  REMEMBER: Instructions that are not followed completely may result in serious medical risk, up to and including death; or upon the discretion of your surgeon and anesthesiologist your surgery may need to be rescheduled.  Do not eat food after midnight the night before surgery.  No gum chewing or hard candies.  You may however, drink CLEAR liquids up to 2 hours before you are scheduled to arrive for your surgery. Do not drink anything within 2 hours of your scheduled arrival time.  Clear liquids include: - water  - apple juice without pulp - gatorade (not RED colors) - black coffee or tea (Do NOT add milk or creamers to the coffee or tea) Do NOT drink anything that is not on this list.    In addition, your doctor has ordered for you to drink the provided:  Ensure Pre-Surgery Clear Carbohydrate Drink -This is clear liquid as well.  Drinking this carbohydrate drink up to two hours before surgery helps to reduce insulin resistance and improve patient outcomes. Please complete drinking 2 hours before scheduled arrival time.  One week prior to surgery: Stop Anti-inflammatories (NSAIDS) such as Advil, Aleve, Ibuprofen, Motrin, Naproxen, Naprosyn and Aspirin based products such as Excedrin, Goody's Powder, BC Powder. Stop ANY OVER THE COUNTER supplements until after surgery like Vit D 3  You may however, continue to take Tylenol if needed for pain up until the day of surgery.    Continue taking all of your other prescription medications up until the day of surgery.  ON THE DAY OF SURGERY ONLY TAKE THESE MEDICATIONS WITH SIPS OF WATER:  Tylenol  as needed  No Alcohol for 24 hours before or after surgery.  No Smoking including e-cigarettes for 24 hours before surgery.  No chewable tobacco products for at least 6 hours before surgery.  No nicotine patches on the day of surgery.  Do not use any "recreational" drugs for at least a week (preferably 2 weeks) before your surgery.  Please be advised that the combination of cocaine and anesthesia may have negative outcomes, up to and including death. If you test positive for cocaine, your surgery will be cancelled.  On the morning of surgery brush your teeth with toothpaste and water, you may rinse your mouth with mouthwash if you wish. Do not swallow any toothpaste or mouthwash.  Use CHG Soap or wipes as directed on instruction sheet.-provided for you  Do not wear jewelry, make-up, hairpins, clips or nail polish.  For welded (permanent) jewelry: bracelets, anklets, waist bands, etc.  Please have this removed prior to surgery.  If it is not removed, there is a chance that hospital personnel will need to cut it off on the day of surgery.  Do not wear lotions, powders, or perfumes.   Do not shave body hair from the neck down 48 hours before surgery.  Contact lenses, hearing aids and dentures may not be worn into surgery.  Do not bring valuables to the hospital. [facility] is not responsible for any missing/lost belongings or valuables.   Notify your doctor if there is any change in your medical condition (cold,  fever, infection).  Wear comfortable clothing (specific to your surgery type) to the hospital.  After surgery, you can help prevent lung complications by doing breathing exercises.  Take deep breaths and cough every 1-2 hours. Your doctor may order a device called an Incentive Spirometer to help you take deep breaths.  If you are being admitted to the hospital overnight, leave your suitcase in the car. After surgery it may be brought to your room.  In case of increased  patient census, it may be necessary for you, the patient, to continue your postoperative care in the Same Day Surgery department.  If you are being discharged the day of surgery, you will not be allowed to drive home. You will need a responsible individual to drive you home and stay with you for 24 hours after surgery.   Please call the Pre-admissions Testing Dept. at [phone] if you have any questions about these instructions.  Surgery Visitation Policy:  Patients having surgery or a procedure may have two visitors.  Children under the age of 43 must have an adult with them who is not the patient.  Temporary Visitor Restrictions Due to increasing cases of flu, RSV and COVID-19: Children ages 43 and under will not be able to visit patients in [facility] hospitals under most circumstances.  Inpatient Visitation:    Visiting hours are 7 a.m. to 8 p.m. Up to four visitors are allowed at one time in a patient room. The visitors may rotate out with other people during the day.  One visitor age 55 or older may stay with the patient overnight and must be in the room by 8 p.m.    Pre-operative 5 CHG Bath Instructions   You can play a key role in reducing the risk of infection after surgery. Your skin needs to be as free of germs as possible. You can reduce the number of germs on your skin by washing with CHG (chlorhexidine gluconate) soap before surgery. CHG is an antiseptic soap that kills germs and continues to kill germs even after washing.   DO NOT use if you have an allergy to chlorhexidine/CHG or antibacterial soaps. If your skin becomes reddened or irritated, stop using the CHG and notify one of our RNs at [phone].   Please shower with the CHG soap starting 4 days before surgery using the following schedule:     Please keep in mind the following:  DO NOT shave, including legs and underarms, starting the day of your first shower.   You may shave your face at any point  before/day of surgery.  Place clean sheets on your bed the day you start using CHG soap. Use a clean washcloth (not used since being washed) for each shower. DO NOT sleep with pets once you start using the CHG.   CHG Shower Instructions:  If you choose to wash your hair and private area, wash first with your normal shampoo/soap.  After you use shampoo/soap, rinse your hair and body thoroughly to remove shampoo/soap residue.  Turn the water OFF and apply about 3 tablespoons (45 ml) of CHG soap to a CLEAN washcloth.  Apply CHG soap ONLY FROM YOUR NECK DOWN TO YOUR TOES (washing for 3-5 minutes)  DO NOT use CHG soap on face, private areas, open wounds, or sores.  Pay special attention to the area where your surgery is being performed.  If you are having back surgery, having someone wash your back for you may be helpful. Wait 2  minutes after CHG soap is applied, then you may rinse off the CHG soap.  Pat dry with a clean towel  Put on clean clothes/pajamas   If you choose to wear lotion, please use ONLY the CHG-compatible lotions on the back of this paper.     Additional instructions for the day of surgery: DO NOT APPLY any lotions, deodorants, cologne, or perfumes.   Put on clean/comfortable clothes.  Brush your teeth.  Ask your nurse before applying any prescription medications to the skin.      CHG Compatible Lotions   Aveeno Moisturizing lotion  Cetaphil Moisturizing Cream  Cetaphil Moisturizing Lotion  Clairol Herbal Essence Moisturizing Lotion, Dry Skin  Clairol Herbal Essence Moisturizing Lotion, Extra Dry Skin  Clairol Herbal Essence Moisturizing Lotion, Normal Skin  Curel Age Defying Therapeutic Moisturizing Lotion with Alpha Hydroxy  Curel Extreme Care Body Lotion  Curel Soothing Hands Moisturizing Hand Lotion  Curel Therapeutic Moisturizing Cream, Fragrance-Free  Curel Therapeutic Moisturizing Lotion, Fragrance-Free  Curel Therapeutic Moisturizing Lotion, Original  Formula  Eucerin Daily Replenishing Lotion  Eucerin Dry Skin Therapy Plus Alpha Hydroxy Crme  Eucerin Dry Skin Therapy Plus Alpha Hydroxy Lotion  Eucerin Original Crme  Eucerin Original Lotion  Eucerin Plus Crme Eucerin Plus Lotion  Eucerin TriLipid Replenishing Lotion  Keri Anti-Bacterial Hand Lotion  Keri Deep Conditioning Original Lotion Dry Skin Formula Softly Scented  Keri Deep Conditioning Original Lotion, Fragrance Free Sensitive Skin Formula  Keri Lotion Fast Absorbing Fragrance Free Sensitive Skin Formula  Keri Lotion Fast Absorbing Softly Scented Dry Skin Formula  Keri Original Lotion  Keri Skin Renewal Lotion Keri Silky Smooth Lotion  Keri Silky Smooth Sensitive Skin Lotion  Nivea Body Creamy Conditioning Oil  Nivea Body Extra Enriched Lotion  Nivea Body Original Lotion  Nivea Body Sheer Moisturizing Lotion Nivea Crme  Nivea Skin Firming Lotion  NutraDerm 30 Skin Lotion  NutraDerm Skin Lotion  NutraDerm Therapeutic Skin Cream  NutraDerm Therapeutic Skin Lotion  ProShield Protective Hand Cream  Provon moisturizing lotion    How to Use an Incentive Spirometer  An incentive spirometer is a tool that measures how well you are filling your lungs with each breath. Learning to take long, deep breaths using this tool can help you keep your lungs clear and active. This may help to reverse or lessen your chance of developing breathing (pulmonary) problems, especially infection. You may be asked to use a spirometer: After a surgery. If you have a lung problem or a history of smoking. After a long period of time when you have been unable to move or be active. If the spirometer includes an indicator to show the highest number that you have reached, your health care provider or respiratory therapist will help you set a goal. Keep a log of your progress as told by your health care provider. What are the risks? Breathing too quickly may cause dizziness or cause you to pass out.  Take your time so you do not get dizzy or light-headed. If you are in pain, you may need to take pain medicine before doing incentive spirometry. It is harder to take a deep breath if you are having pain. How to use your incentive spirometer  Sit up on the edge of your bed or on a chair. Hold the incentive spirometer so that it is in an upright position. Before you use the spirometer, breathe out normally. Place the mouthpiece in your mouth. Make sure your lips are closed tightly around it. Breathe in slowly  and as deeply as you can through your mouth, causing the piston or the ball to rise toward the top of the chamber. Hold your breath for 3-5 seconds, or for as long as possible. If the spirometer includes a coach indicator, use this to guide you in breathing. Slow down your breathing if the indicator goes above the marked areas. Remove the mouthpiece from your mouth and breathe out normally. The piston or ball will return to the bottom of the chamber. Rest for a few seconds, then repeat the steps 10 or more times. Take your time and take a few normal breaths between deep breaths so that you do not get dizzy or light-headed. Do this every 1-2 hours when you are awake. If the spirometer includes a goal marker to show the highest number you have reached (best effort), use this as a goal to work toward during each repetition. After each set of 10 deep breaths, cough a few times. This will help to make sure that your lungs are clear. If you have an incision on your chest or abdomen from surgery, place a pillow or a rolled-up towel firmly against the incision when you cough. This can help to reduce pain while taking deep breaths and coughing. General tips When you are able to get out of bed: Walk around often. Continue to take deep breaths and cough in order to clear your lungs. Keep using the incentive spirometer until your health care provider says it is okay to stop using it. If you have been  in the hospital, you may be told to keep using the spirometer at home. Contact a health care provider if: You are having difficulty using the spirometer. You have trouble using the spirometer as often as instructed. Your pain medicine is not giving enough relief for you to use the spirometer as told. You have a fever. Get help right away if: You develop shortness of breath. You develop a cough with bloody mucus from the lungs. You have fluid or blood coming from an incision site after you cough. Summary An incentive spirometer is a tool that can help you learn to take long, deep breaths to keep your lungs clear and active. You may be asked to use a spirometer after a surgery, if you have a lung problem or a history of smoking, or if you have been inactive for a long period of time. Use your incentive spirometer as instructed every 1-2 hours while you are awake. If you have an incision on your chest or abdomen, place a pillow or a rolled-up towel firmly against your incision when you cough. This will help to reduce pain. Get help right away if you have shortness of breath, you cough up bloody mucus, or blood comes from your incision when you cough. This information is not intended to replace advice given to you by your health care provider. Make sure you discuss any questions you have with your health care provider. Document Revised: 06/15/2019 Document Reviewed: 06/15/2019 Elsevier Patient Education  2023 [employer].

## 2023-05-28 ENCOUNTER — Encounter
Admission: RE | Admit: 2023-05-28 | Discharge: 2023-05-28 | Disposition: A | Discharge status: Home / Self Care | Payer: BC Managed Care – PPO | Source: Ambulatory Visit | Attending: Orthopedic Surgery | Admitting: Orthopedic Surgery

## 2023-05-28 ENCOUNTER — Encounter: Payer: Self-pay | Admitting: Urgent Care

## 2023-05-28 ENCOUNTER — Ambulatory Visit
Admission: EM | Admit: 2023-05-28 | Discharge: 2023-05-28 | Disposition: A | Discharge status: Home / Self Care | Payer: BC Managed Care – PPO | Attending: Emergency Medicine | Admitting: Emergency Medicine

## 2023-05-28 DIAGNOSIS — M87051 Idiopathic aseptic necrosis of right femur: Secondary | ICD-10-CM | POA: Insufficient documentation

## 2023-05-28 DIAGNOSIS — M87052 Idiopathic aseptic necrosis of left femur: Secondary | ICD-10-CM | POA: Insufficient documentation

## 2023-05-28 DIAGNOSIS — J069 Acute upper respiratory infection, unspecified: Secondary | ICD-10-CM | POA: Diagnosis present

## 2023-05-28 DIAGNOSIS — R8271 Bacteriuria: Secondary | ICD-10-CM | POA: Diagnosis not present

## 2023-05-28 DIAGNOSIS — R8290 Unspecified abnormal findings in urine: Secondary | ICD-10-CM | POA: Diagnosis not present

## 2023-05-28 DIAGNOSIS — Z01812 Encounter for preprocedural laboratory examination: Secondary | ICD-10-CM | POA: Insufficient documentation

## 2023-05-28 DIAGNOSIS — R8281 Pyuria: Secondary | ICD-10-CM | POA: Insufficient documentation

## 2023-05-28 LAB — URINALYSIS, ROUTINE W REFLEX MICROSCOPIC
Bilirubin Urine: NEGATIVE
Glucose, UA: NEGATIVE mg/dL
Ketones, ur: NEGATIVE mg/dL
Nitrite: NEGATIVE
Protein, ur: 100 mg/dL — AB
Specific Gravity, Urine: 1.013 (ref 1.005–1.030)
WBC, UA: >50 WBC/hpf (ref 0–5)
pH: 6 (ref 5.0–8.0)

## 2023-05-28 LAB — CBC WITH DIFFERENTIAL/PLATELET
Abs Immature Granulocytes: 0.05 10*3/uL (ref 0.00–0.07)
Basophils Absolute: 0 10*3/uL (ref 0.0–0.1)
Basophils Relative: 0 %
Eosinophils Absolute: 0 10*3/uL (ref 0.0–0.5)
Eosinophils Relative: 0 %
HCT: 33.8 % — ABNORMAL LOW (ref 36.0–46.0)
Hemoglobin: 11.9 g/dL — ABNORMAL LOW (ref 12.0–15.0)
Immature Granulocytes: 0 %
Lymphocytes Relative: 5 %
Lymphs Abs: 0.6 10*3/uL — ABNORMAL LOW (ref 0.7–4.0)
MCH: 33.7 pg (ref 26.0–34.0)
MCHC: 35.2 g/dL (ref 30.0–36.0)
MCV: 95.8 fL (ref 80.0–100.0)
Monocytes Absolute: 1 10*3/uL (ref 0.1–1.0)
Monocytes Relative: 8 %
Neutro Abs: 11.2 10*3/uL — ABNORMAL HIGH (ref 1.7–7.7)
Neutrophils Relative %: 87 %
Platelets: 299 10*3/uL (ref 150–400)
RBC: 3.53 MIL/uL — ABNORMAL LOW (ref 3.87–5.11)
RDW: 12.3 % (ref 11.5–15.5)
WBC: 12.9 10*3/uL — ABNORMAL HIGH (ref 4.0–10.5)
nRBC: 0 % (ref 0.0–0.2)

## 2023-05-28 LAB — COMPREHENSIVE METABOLIC PANEL
ALT: 21 U/L (ref 0–44)
AST: 20 U/L (ref 15–41)
Albumin: 3.6 g/dL (ref 3.5–5.0)
Alkaline Phosphatase: 60 U/L (ref 38–126)
Anion gap: 11 (ref 5–15)
BUN: 6 mg/dL (ref 6–20)
CO2: 23 mmol/L (ref 22–32)
Calcium: 8.7 mg/dL — ABNORMAL LOW (ref 8.9–10.3)
Chloride: 102 mmol/L (ref 98–111)
Creatinine, Ser: 0.75 mg/dL (ref 0.44–1.00)
GFR, Estimated: >60 mL/min (ref >60)
Glucose, Bld: 110 mg/dL — ABNORMAL HIGH (ref 70–99)
Potassium: 3.4 mmol/L — ABNORMAL LOW (ref 3.5–5.1)
Sodium: 136 mmol/L (ref 135–145)
Total Bilirubin: 1.1 mg/dL (ref 0.0–1.2)
Total Protein: 6.9 g/dL (ref 6.5–8.1)

## 2023-05-28 LAB — TYPE AND SCREEN
ABO/RH(D): O POS
Antibody Screen: NEGATIVE

## 2023-05-28 LAB — RESP PANEL BY RT-PCR (FLU A&B, COVID) ARPGX2
Influenza A by PCR: NEGATIVE
Influenza B by PCR: NEGATIVE
SARS Coronavirus 2 by RT PCR: NEGATIVE

## 2023-05-28 LAB — SURGICAL PCR SCREEN
MRSA, PCR: NEGATIVE
Staphylococcus aureus: NEGATIVE

## 2023-05-28 MED ORDER — IPRATROPIUM BROMIDE 0.06 % NA SOLN: 2.0000 | Freq: Four times a day (QID) | NASAL | 12 refills | Status: AC

## 2023-05-28 MED ORDER — KETOROLAC TROMETHAMINE 30 MG/ML IJ SOLN
30.0000 mg | Freq: Once | INTRAMUSCULAR | Status: AC
  Administered 2023-05-28: 30 mg via INTRAMUSCULAR

## 2023-05-28 NOTE — ED Provider Notes (Signed)
 [facility]    CSN: [id number] Arrival date & time: 05/28/23  1456      History   Chief Complaint Chief Complaint  Patient presents with   Fever   Chills   Generalized Body Aches    HPI Alice Andrews is a 43 y.o. female.   HPI  43 year old female with past medical history significant for anemia, vitamin D deficiency, arthritis, and migraine headaches presents for evaluation of flulike symptoms that began 2 days ago.  They started with vomiting and diarrhea followed by fever with a Tmax 103, runny nose, nasal congestion, chills, ear pain, and significant body aches.  She denies any cough or sore throat.  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    KNEE ARTHROSCOPY WITH LATERAL MENISECTOMY Left 07/07/2021   Procedure: Left knee arthroscopy with lateral femoral microfracture, medial femoral chondroplasty;  Surgeon: Signa Kell, MD;  Location: [facility];  Service: Orthopedics;  Laterality: Left;   ROBOTIC RESECTION OF LEFT PELVIC MASS   04/2022   [facility]   TOTAL HIP ARTHROPLASTY Left 02/18/2023   Procedure: TOTAL HIP ARTHROPLASTY ANTERIOR APPROACH;  Surgeon: Reinaldo Berber, MD;   Location: [facility];  Service: Orthopedics;  Laterality: Left;    OB History   No obstetric history on file.      Home Medications    Prior to Admission medications   Medication Sig Start Date End Date Taking? Authorizing Provider  ipratropium (ATROVENT) 0.06 % nasal spray Place 2 sprays into both nostrils 4 (four) times daily. 05/28/23  Yes Becky Augusta, NP  acetaminophen (TYLENOL) 500 MG tablet Take 2 tablets (1,000 mg total) by mouth every 8 (eight) hours. 02/19/23   Evon Slack, PA-C  Cholecalciferol (VITAMIN D3 ULTRA STRENGTH) 125 MCG (5000 UT) capsule Take 5,000 Units by mouth daily.    [provider]    Family History History reviewed. No pertinent family history.  Social History Social History   Tobacco Use   Smoking status: Former    Current packs/day: 0.00    Types: Cigarettes    Quit date: 2019    Years since quitting: 6.1    Passive exposure: Current   Smokeless tobacco: Never  Vaping Use   Vaping status: Never Used  Substance Use Topics   Alcohol use: Not Currently   Drug use: Never     Allergies   Patient has no known allergies.   Review of Systems Review of Systems  Constitutional:  Positive for  fever.  HENT:  Positive for congestion, ear pain and rhinorrhea. Negative for sore throat.   Respiratory:  Negative for cough, shortness of breath and wheezing.   Gastrointestinal:  Positive for diarrhea, nausea and vomiting.  Musculoskeletal:  Positive for arthralgias and myalgias.     Physical Exam Triage Vital Signs ED Triage Vitals  Encounter Vitals Group     BP      Systolic BP Percentile      Diastolic BP Percentile      Pulse      Resp      Temp      Temp src      SpO2      Weight      Height      Head Circumference      Peak Flow      Pain Score      Pain Loc      Pain Education      Exclude from Growth Chart    No data found.  Updated Vital Signs BP 124/78   Pulse (!) 102   Temp 99.6 F (37.6 C) (Oral)    Resp 16   LMP 02/08/2015   SpO2 96%   Visual Acuity Right Eye Distance:   Left Eye Distance:   Bilateral Distance:    Right Eye Near:   Left Eye Near:    Bilateral Near:     Physical Exam Vitals and nursing note reviewed.  Constitutional:      Appearance: Normal appearance. She is not ill-appearing.  HENT:     Head: Normocephalic and atraumatic.     Right Ear: Tympanic membrane, ear canal and external ear normal. There is no impacted cerumen.     Left Ear: Tympanic membrane, ear canal and external ear normal. There is no impacted cerumen.     Nose: Congestion and rhinorrhea present.     Comments: Nasal mucosa is erythematous and edematous with clear discharge in both nares.    Mouth/Throat:     Mouth: Mucous membranes are moist.     Pharynx: Oropharynx is clear. No oropharyngeal exudate or posterior oropharyngeal erythema.  Cardiovascular:     Rate and Rhythm: Normal rate and regular rhythm.     Pulses: Normal pulses.     Heart sounds: Normal heart sounds. No murmur heard.    No friction rub. No gallop.  Pulmonary:     Effort: Pulmonary effort is normal.     Breath sounds: Normal breath sounds. No wheezing, rhonchi or rales.  Musculoskeletal:     Cervical back: Normal range of motion and neck supple. No tenderness.  Lymphadenopathy:     Cervical: No cervical adenopathy.  Skin:    General: Skin is warm and dry.     Capillary Refill: Capillary refill takes less than 2 seconds.     Findings: No rash.  Neurological:     General: No focal deficit present.     Mental Status: She is alert and oriented to person, place, and time.      UC Treatments / Results  Labs (all labs ordered are listed, but only abnormal results are displayed) Labs Reviewed  RESP PANEL BY RT-PCR (FLU A&B, COVID) [id number]    EKG   Radiology No results found.  Procedures Procedures (including critical care time)  Medications Ordered in UC Medications  ketorolac (TORADOL) 30 MG/ML  injection 30 mg (30 mg Intramuscular Given 05/28/23 1627)    Initial Impression / Assessment and Plan / UC Course  I have reviewed the triage vital signs and the nursing notes.  Pertinent labs & imaging results that were available during my care of the patient were reviewed by me and considered in my medical decision making (see chart for details).   Patient is a nontoxic-appearing 43 year old female who does appear to be in a moderate degree of pain presenting for evaluation of flulike symptoms that started 2 days ago as outlined HPI above.  Symptoms began with vomiting and diarrhea which has both resolved and then morphed into significant body aches.  The bodyaches are making the patient tearful in the exam room.  She has not take anything for pain today I offered a choice of oral medication or an injection and she chose injection so I will order 30 mg of IM Toradol.  I will also order a COVID and flu PCR.  Panel is negative for COVID or influenza.  I will discharge patient home with a diagnosis of viral URI.  I prescribed Atrovent Nasabid help the nasal congestion.  The patient can continue to use over-the-counter Tylenol and ibuprofen as needed for any fever or pain.   Final Clinical Impressions(s) / UC Diagnoses   Final diagnoses:  Viral upper respiratory tract infection     Discharge Instructions      Your testing today was negative for COVID and influenza.  I do believe, based upon your physical exam, that you have a viral infection that is causing your symptoms.  Use over-the-counter Tylenol and/or ibuprofen according the package instructions as needed for any fever or pain.  Use the Atrovent nasal spray, 2 squirts up each nostril every 6 hours, as needed for runny nose nasal congestion.  If you develop any new or worsening symptoms other return for reevaluation or follow-up with your primary care provider.     ED Prescriptions     Medication Sig Dispense Auth. Provider    ipratropium (ATROVENT) 0.06 % nasal spray Place 2 sprays into both nostrils 4 (four) times daily. 15 mL Becky Augusta, NP      PDMP not reviewed this encounter.   Becky Augusta, NP 05/28/23 [phone]

## 2023-05-28 NOTE — ED Triage Notes (Signed)
 Patient presents to UC for chills, body aches, and fever since Sunday. Treating symptoms with Theraflu, tylenol, and ibuprofen. Last dose of tylenol, 1100 today.

## 2023-05-28 NOTE — Discharge Instructions (Signed)
 Your testing today was negative for COVID and influenza.  I do believe, based upon your physical exam, that you have a viral infection that is causing your symptoms.  Use over-the-counter Tylenol and/or ibuprofen according the package instructions as needed for any fever or pain.  Use the Atrovent nasal spray, 2 squirts up each nostril every 6 hours, as needed for runny nose nasal congestion.  If you develop any new or worsening symptoms other return for reevaluation or follow-up with your primary care provider.

## 2023-05-30 ENCOUNTER — Telehealth: Payer: Self-pay | Admitting: Urgent Care

## 2023-05-30 DIAGNOSIS — Z01812 Encounter for preprocedural laboratory examination: Secondary | ICD-10-CM

## 2023-05-30 DIAGNOSIS — B9620 Unspecified Escherichia coli [E. coli] as the cause of diseases classified elsewhere: Secondary | ICD-10-CM

## 2023-05-30 DIAGNOSIS — Z8619 Personal history of other infectious and parasitic diseases: Secondary | ICD-10-CM

## 2023-05-30 LAB — URINE CULTURE: Culture: >=100000 — AB

## 2023-05-30 MED ORDER — CEPHALEXIN 500 MG PO CAPS: 500.0000 mg | ORAL_CAPSULE | Freq: Two times a day (BID) | ORAL | 0 refills | Status: AC

## 2023-05-30 MED ORDER — FLUCONAZOLE 150 MG PO TABS: ORAL_TABLET | ORAL | 0 refills | Status: AC

## 2023-05-30 NOTE — Progress Notes (Signed)
 [facility] Perioperative Services: Pre-Admission/Anesthesia Testing  Abnormal Lab Notification and Treatment Plan of Care   Date: 05/30/23  Name: Alice Andrews MRN:   [id number]  Re: Abnormal labs noted during PAT appointment   Notified:  Provider Name Provider Role Notification Mode  Reinaldo Berber, MD Orthopedics (Surgeon) Routed and/or faxed via [facility]   Abnormal Lab Value(s):   Lab Results  Component Value Date   COLORURINE YELLOW (A) 05/28/2023   APPEARANCEUR HAZY (A) 05/28/2023   LABSPEC 1.013 05/28/2023   PHURINE 6.0 05/28/2023   GLUCOSEU NEGATIVE 05/28/2023   HGBUR MODERATE (A) 05/28/2023   BILIRUBINUR NEGATIVE 05/28/2023   KETONESUR NEGATIVE 05/28/2023   PROTEINUR 100 (A) 05/28/2023   NITRITE NEGATIVE 05/28/2023   LEUKOCYTESUR LARGE (A) 05/28/2023   EPIU 0-5 05/28/2023   WBCU >50 05/28/2023   RBCU 0-5 05/28/2023   BACTERIA RARE (A) 05/28/2023   CULT >=100,000 COLONIES/mL ESCHERICHIA COLI (A) 05/28/2023    Clinical Information and Notes:  Patient is scheduled for TOTAL HIP ARTHROPLASTY ANTERIOR APPROACH on 06/03/2023.    UA performed in PAT consistent with/concerning for infection.  (+) leukocytosis noted on CBC; WBC 12.9 Renal function: Estimated Creatinine Clearance: 80.7 mL/min (by C-G formula based on SCr of 0.75 mg/dL). Urine C&S added to assess for pathogenically significant growth.  Impression and Plan:  Alice Andrews with a UA that was (+) for infection; reflex culture sent.  Culture grew out a pathogenically significant colony count of Escherichia coli.  Contacted patient to discuss. Patient reporting that she is experiencing frequency, urgency, and is starting to have some mild dysuria.  Denies gross hematuria, malodorous urine, abdominal pain, back pain, nausea, vomiting, fever/chills..  Patient reports that she is prone to recurrent UTIs.  Additionally, she is apt to have vulvovaginal candidiasis associated with  antimicrobial use. Patient with surgery scheduled soon. In efforts to avoid delaying patient's procedure, or have her experience any potentially significant perioperative complications related to the aforementioned, I would like to proceed with empiric treatment for urinary tract infection.  Allergies reviewed. Culture report also reviewed to ensure culture appropriate coverage is being provided. Will treat with a 5 day course of CEPHALEXIN. Patient encouraged to complete the entire course of antibiotics even if she begins to feel better. She was advised that if culture demonstrates resistance to the prescribed antibiotic, she will be contacted and advised of the need to change the antibiotic being used to treat her infection.   Meds ordered this encounter  Medications   cephALEXin (KEFLEX) 500 MG capsule    Sig: Take 1 capsule (500 mg total) by mouth 2 (two) times daily for 5 days. Increase WATER intake while taking this medication.    Dispense:  10 capsule    Refill:  0    Please notify patient when Rx available for pickup.  Rx is for preoperative UTI treatment and needs to be started ASAP.   fluconazole (DIFLUCAN) 150 MG tablet    Sig: Take 1 tablet (150 mg) PO x 1 dose. May repeat 150 mg dose in 3 days if still symptomatic.    Dispense:  2 tablet    Refill:  0   Patient encouraged to increase her fluid intake as much as possible. Discussed that water is always best to flush the urinary tract. She was advised to avoid caffeine containing fluids until her infections clears, as caffeine can cause her to experience painful bladder spasms.   May use Tylenol as needed for pain/fever should  she experience these symptoms. May also use over the counter phenazopyridine to help relieve her current urinary pain.   Patient has has a history of vulvovaginal candidiasis in the past while on oral antimicrobial therapy. Will send in prophylactic fluconazole dose (150 mg x 1 - may repeat in 72 hours if still  symptomatic) for patient to use should she develop symptoms.   Patient instructed to call surgeon's office or PAT with any questions or concerns related to the above outlined course of treatment. Additionally, she was instructed to call if she feels like she is getting worse overall while on treatment. Results and treatment plan of care forwarded to primary attending surgeon to make them aware.   Encounter Diagnoses  Name Primary?   Pre-operative laboratory examination Yes   E. coli UTI (urinary tract infection)    History of candidal vulvovaginitis    Alice Mulling, MSN, APRN, FNP-C, CEN [facility]  Perioperative Services Nurse Practitioner Phone: [phone] Fax: [phone] 05/30/23 1:29 PM  NOTE: This note has been prepared using Dragon dictation software. Despite my best ability to proofread, there is always the potential that unintentional transcriptional errors may still occur from this process.

## 2023-06-03 ENCOUNTER — Encounter: Payer: Self-pay | Admitting: Orthopedic Surgery

## 2023-06-03 ENCOUNTER — Ambulatory Visit
Admission: RE | Admit: 2023-06-03 | Discharge: 2023-06-04 | Disposition: A | Discharge status: Home Health | Payer: BC Managed Care – PPO | Attending: Orthopedic Surgery | Admitting: Orthopedic Surgery

## 2023-06-03 ENCOUNTER — Ambulatory Visit: Payer: Self-pay

## 2023-06-03 ENCOUNTER — Ambulatory Visit: Payer: BC Managed Care – PPO

## 2023-06-03 ENCOUNTER — Other Ambulatory Visit: Payer: Self-pay

## 2023-06-03 ENCOUNTER — Encounter
Admission: RE | Disposition: A | Discharge status: Home Health | Payer: Self-pay | Source: Home / Self Care | Attending: Orthopedic Surgery

## 2023-06-03 DIAGNOSIS — M87851 Other osteonecrosis, right femur: Secondary | ICD-10-CM | POA: Insufficient documentation

## 2023-06-03 DIAGNOSIS — M1611 Unilateral primary osteoarthritis, right hip: Secondary | ICD-10-CM | POA: Insufficient documentation

## 2023-06-03 DIAGNOSIS — Z87891 Personal history of nicotine dependence: Secondary | ICD-10-CM | POA: Insufficient documentation

## 2023-06-03 DIAGNOSIS — Z96641 Presence of right artificial hip joint: Secondary | ICD-10-CM | POA: Diagnosis present

## 2023-06-03 HISTORY — PX: TOTAL HIP ARTHROPLASTY: SHX124

## 2023-06-03 SURGERY — ARTHROPLASTY, HIP, TOTAL, ANTERIOR APPROACH
Anesthesia: Spinal | Site: Hip | Laterality: Right

## 2023-06-03 MED ORDER — ORAL CARE MOUTH RINSE: 15.0000 mL | Freq: Once | OROMUCOSAL | Status: AC

## 2023-06-03 MED ORDER — SODIUM CHLORIDE 0.9 % IV SOLN: INTRAVENOUS | Status: DC

## 2023-06-03 MED ORDER — 0.9 % SODIUM CHLORIDE (POUR BTL) OPTIME
TOPICAL | Status: DC | PRN
  Administered 2023-06-03: 500 mL

## 2023-06-03 MED ORDER — BUPIVACAINE LIPOSOME 1.3 % IJ SUSP
INTRAMUSCULAR | Status: AC
  Filled 2023-06-03: qty 20

## 2023-06-03 MED ORDER — BUPIVACAINE IN DEXTROSE 0.75-8.25 % IT SOLN
INTRATHECAL | Status: DC | PRN
  Administered 2023-06-03: 1.6 mL via INTRATHECAL

## 2023-06-03 MED ORDER — CELECOXIB 200 MG PO CAPS: 200.0000 mg | ORAL_CAPSULE | Freq: Two times a day (BID) | ORAL | 0 refills | Status: AC

## 2023-06-03 MED ORDER — TRAMADOL HCL 50 MG PO TABS
50.0000 mg | ORAL_TABLET | Freq: Four times a day (QID) | ORAL | Status: DC | PRN
  Administered 2023-06-03 – 2023-06-04 (×3): 50 mg via ORAL
  Filled 2023-06-03 (×3): qty 1

## 2023-06-03 MED ORDER — KETAMINE HCL 50 MG/5ML IJ SOSY
PREFILLED_SYRINGE | INTRAMUSCULAR | Status: DC | PRN
  Administered 2023-06-03 (×2): 10 mg via INTRAVENOUS

## 2023-06-03 MED ORDER — ACETAMINOPHEN 500 MG PO TABS
1000.0000 mg | ORAL_TABLET | Freq: Three times a day (TID) | ORAL | Status: DC
  Administered 2023-06-04: 1000 mg via ORAL
  Filled 2023-06-03: qty 2

## 2023-06-03 MED ORDER — TRANEXAMIC ACID-NACL 1000-0.7 MG/100ML-% IV SOLN
INTRAVENOUS | Status: AC
  Filled 2023-06-03: qty 100

## 2023-06-03 MED ORDER — FENTANYL CITRATE (PF) 100 MCG/2ML IJ SOLN
25.0000 ug | INTRAMUSCULAR | Status: DC | PRN
  Administered 2023-06-03 (×4): 50 ug via INTRAVENOUS

## 2023-06-03 MED ORDER — ACETAMINOPHEN 10 MG/ML IV SOLN
INTRAVENOUS | Status: AC
  Filled 2023-06-03: qty 100

## 2023-06-03 MED ORDER — ACETAMINOPHEN 325 MG PO TABS: 325.0000 mg | ORAL_TABLET | Freq: Four times a day (QID) | ORAL | Status: DC | PRN

## 2023-06-03 MED ORDER — TRAMADOL HCL 50 MG PO TABS: 50.0000 mg | ORAL_TABLET | Freq: Four times a day (QID) | ORAL | 0 refills | Status: AC | PRN

## 2023-06-03 MED ORDER — DOCUSATE SODIUM 100 MG PO CAPS
100.0000 mg | ORAL_CAPSULE | Freq: Two times a day (BID) | ORAL | Status: DC
  Administered 2023-06-03 – 2023-06-04 (×3): 100 mg via ORAL
  Filled 2023-06-03 (×3): qty 1

## 2023-06-03 MED ORDER — ONDANSETRON HCL 4 MG/2ML IJ SOLN
INTRAMUSCULAR | Status: DC | PRN
Start: 2023-06-03 — End: 2023-06-03
  Administered 2023-06-03: 4 mg via INTRAVENOUS

## 2023-06-03 MED ORDER — ONDANSETRON HCL 4 MG PO TABS: 4.0000 mg | ORAL_TABLET | Freq: Four times a day (QID) | ORAL | Status: DC | PRN

## 2023-06-03 MED ORDER — CEFAZOLIN SODIUM-DEXTROSE 2-4 GM/100ML-% IV SOLN
2.0000 g | INTRAVENOUS | Status: AC
  Administered 2023-06-03: 2 g via INTRAVENOUS

## 2023-06-03 MED ORDER — METOCLOPRAMIDE HCL 5 MG PO TABS: 5.0000 mg | ORAL_TABLET | Freq: Three times a day (TID) | ORAL | Status: DC | PRN

## 2023-06-03 MED ORDER — CHLORHEXIDINE GLUCONATE 0.12 % MT SOLN
OROMUCOSAL | Status: AC
  Filled 2023-06-03: qty 15

## 2023-06-03 MED ORDER — BUPIVACAINE-EPINEPHRINE (PF) 0.25% -1:200000 IJ SOLN
INTRAMUSCULAR | Status: AC
  Filled 2023-06-03: qty 30

## 2023-06-03 MED ORDER — OXYCODONE HCL 5 MG PO TABS
ORAL_TABLET | ORAL | Status: AC
  Filled 2023-06-03: qty 1

## 2023-06-03 MED ORDER — OXYCODONE HCL 5 MG PO TABS
5.0000 mg | ORAL_TABLET | Freq: Once | ORAL | Status: AC | PRN
  Administered 2023-06-03: 5 mg via ORAL

## 2023-06-03 MED ORDER — MENTHOL 3 MG MT LOZG: 1.0000 | LOZENGE | OROMUCOSAL | Status: DC | PRN

## 2023-06-03 MED ORDER — SODIUM CHLORIDE (PF) 0.9 % IJ SOLN
INTRAMUSCULAR | Status: DC | PRN
  Administered 2023-06-03: 50 mL

## 2023-06-03 MED ORDER — SURGIPHOR WOUND IRRIGATION SYSTEM - OPTIME: TOPICAL | Status: DC | PRN

## 2023-06-03 MED ORDER — PHENOL 1.4 % MT LIQD: 1.0000 | OROMUCOSAL | Status: DC | PRN

## 2023-06-03 MED ORDER — ENOXAPARIN SODIUM 40 MG/0.4ML IJ SOSY: 40.0000 mg | PREFILLED_SYRINGE | INTRAMUSCULAR | 0 refills | Status: AC

## 2023-06-03 MED ORDER — FENTANYL CITRATE (PF) 100 MCG/2ML IJ SOLN
INTRAMUSCULAR | Status: AC
  Filled 2023-06-03: qty 2

## 2023-06-03 MED ORDER — SODIUM CHLORIDE 0.9 % IR SOLN
Status: DC | PRN
  Administered 2023-06-03: 250 mL

## 2023-06-03 MED ORDER — KETOROLAC TROMETHAMINE 15 MG/ML IJ SOLN
15.0000 mg | Freq: Four times a day (QID) | INTRAMUSCULAR | Status: AC
  Administered 2023-06-03 – 2023-06-04 (×4): 15 mg via INTRAVENOUS
  Filled 2023-06-03 (×3): qty 1

## 2023-06-03 MED ORDER — DEXAMETHASONE SODIUM PHOSPHATE 10 MG/ML IJ SOLN
8.0000 mg | Freq: Once | INTRAMUSCULAR | Status: AC
  Administered 2023-06-03: 8 mg via INTRAVENOUS

## 2023-06-03 MED ORDER — FENTANYL CITRATE (PF) 100 MCG/2ML IJ SOLN
INTRAMUSCULAR | Status: DC | PRN
  Administered 2023-06-03: 50 ug via INTRAVENOUS

## 2023-06-03 MED ORDER — METOCLOPRAMIDE HCL 5 MG/ML IJ SOLN: 5.0000 mg | Freq: Three times a day (TID) | INTRAMUSCULAR | Status: DC | PRN

## 2023-06-03 MED ORDER — ONDANSETRON HCL 4 MG/2ML IJ SOLN
INTRAMUSCULAR | Status: AC
  Filled 2023-06-03: qty 2

## 2023-06-03 MED ORDER — MIDAZOLAM HCL 5 MG/5ML IJ SOLN
INTRAMUSCULAR | Status: DC | PRN
  Administered 2023-06-03: 2 mg via INTRAVENOUS

## 2023-06-03 MED ORDER — OXYCODONE HCL 5 MG/5ML PO SOLN: 5.0000 mg | Freq: Once | ORAL | Status: AC | PRN

## 2023-06-03 MED ORDER — ACETAMINOPHEN 500 MG PO TABS: 1000.0000 mg | ORAL_TABLET | Freq: Three times a day (TID) | ORAL | 0 refills | Status: AC

## 2023-06-03 MED ORDER — HYDROCODONE-ACETAMINOPHEN 5-325 MG PO TABS
1.0000 | ORAL_TABLET | ORAL | Status: DC | PRN
  Administered 2023-06-03 – 2023-06-04 (×3): 2 via ORAL
  Filled 2023-06-03 (×5): qty 2

## 2023-06-03 MED ORDER — SODIUM CHLORIDE (PF) 0.9 % IJ SOLN
INTRAMUSCULAR | Status: AC
  Filled 2023-06-03: qty 10

## 2023-06-03 MED ORDER — CHLORHEXIDINE GLUCONATE 0.12 % MT SOLN
15.0000 mL | Freq: Once | OROMUCOSAL | Status: AC
  Administered 2023-06-03: 15 mL via OROMUCOSAL

## 2023-06-03 MED ORDER — CEFAZOLIN SODIUM-DEXTROSE 2-4 GM/100ML-% IV SOLN
INTRAVENOUS | Status: AC
  Filled 2023-06-03: qty 100

## 2023-06-03 MED ORDER — MORPHINE SULFATE (PF) 4 MG/ML IV SOLN
0.5000 mg | INTRAVENOUS | Status: DC | PRN
  Administered 2023-06-03 (×2): 1 mg via INTRAVENOUS
  Filled 2023-06-03 (×2): qty 1

## 2023-06-03 MED ORDER — MIDAZOLAM HCL 2 MG/2ML IJ SOLN
INTRAMUSCULAR | Status: AC
  Filled 2023-06-03: qty 2

## 2023-06-03 MED ORDER — PANTOPRAZOLE SODIUM 40 MG PO TBEC
40.0000 mg | DELAYED_RELEASE_TABLET | Freq: Every day | ORAL | Status: DC
  Administered 2023-06-03 – 2023-06-04 (×2): 40 mg via ORAL
  Filled 2023-06-03 (×2): qty 1

## 2023-06-03 MED ORDER — CEFAZOLIN SODIUM-DEXTROSE 2-4 GM/100ML-% IV SOLN
2.0000 g | Freq: Four times a day (QID) | INTRAVENOUS | Status: AC
  Administered 2023-06-03 (×2): 2 g via INTRAVENOUS
  Filled 2023-06-03 (×2): qty 100

## 2023-06-03 MED ORDER — PHENYLEPHRINE HCL-NACL 20-0.9 MG/250ML-% IV SOLN
INTRAVENOUS | Status: DC | PRN
  Administered 2023-06-03: 22.667 ug/min via INTRAVENOUS

## 2023-06-03 MED ORDER — LACTATED RINGERS IV SOLN: INTRAVENOUS | Status: DC

## 2023-06-03 MED ORDER — ENOXAPARIN SODIUM 40 MG/0.4ML IJ SOSY
40.0000 mg | PREFILLED_SYRINGE | INTRAMUSCULAR | Status: DC
  Administered 2023-06-04: 40 mg via SUBCUTANEOUS
  Filled 2023-06-03: qty 0.4

## 2023-06-03 MED ORDER — PROPOFOL 1000 MG/100ML IV EMUL
INTRAVENOUS | Status: AC
  Filled 2023-06-03: qty 100

## 2023-06-03 MED ORDER — EPHEDRINE SULFATE-NACL 50-0.9 MG/10ML-% IV SOSY
PREFILLED_SYRINGE | INTRAVENOUS | Status: DC | PRN
  Administered 2023-06-03 (×2): 5 mg via INTRAVENOUS

## 2023-06-03 MED ORDER — KETAMINE HCL 50 MG/5ML IJ SOSY
PREFILLED_SYRINGE | INTRAMUSCULAR | Status: AC
  Filled 2023-06-03: qty 5

## 2023-06-03 MED ORDER — ONDANSETRON HCL 4 MG PO TABS: 4.0000 mg | ORAL_TABLET | Freq: Four times a day (QID) | ORAL | 0 refills | Status: AC | PRN

## 2023-06-03 MED ORDER — ACETAMINOPHEN 10 MG/ML IV SOLN
INTRAVENOUS | Status: DC | PRN
  Administered 2023-06-03: 1000 mg via INTRAVENOUS

## 2023-06-03 MED ORDER — DOCUSATE SODIUM 100 MG PO CAPS: 100.0000 mg | ORAL_CAPSULE | Freq: Two times a day (BID) | ORAL | 0 refills | Status: AC

## 2023-06-03 MED ORDER — TRANEXAMIC ACID-NACL 1000-0.7 MG/100ML-% IV SOLN
1000.0000 mg | INTRAVENOUS | Status: AC
Start: 2023-06-03 — End: 2023-06-03
  Administered 2023-06-03 (×2): 1000 mg via INTRAVENOUS

## 2023-06-03 MED ORDER — ONDANSETRON HCL 4 MG/2ML IJ SOLN: 4.0000 mg | Freq: Four times a day (QID) | INTRAMUSCULAR | Status: DC | PRN

## 2023-06-03 MED ORDER — PROPOFOL 500 MG/50ML IV EMUL
INTRAVENOUS | Status: DC | PRN
  Administered 2023-06-03: 100 ug/kg/min via INTRAVENOUS
  Administered 2023-06-03: 7500 ug via INTRAVENOUS
  Administered 2023-06-03: 150 ug/kg/min via INTRAVENOUS
  Administered 2023-06-03: 20 mg via INTRAVENOUS
  Administered 2023-06-03: 11200 ug via INTRAVENOUS

## 2023-06-03 MED ORDER — OXYCODONE HCL 5 MG PO TABS
2.5000 mg | ORAL_TABLET | Freq: Three times a day (TID) | ORAL | 0 refills | Status: AC | PRN
Start: 2023-06-03 — End: 2024-06-02

## 2023-06-03 MED ORDER — KETOROLAC TROMETHAMINE 15 MG/ML IJ SOLN
INTRAMUSCULAR | Status: AC
  Filled 2023-06-03: qty 1

## 2023-06-03 SURGICAL SUPPLY — 61 items
BLADE CLIPPER SURG (BLADE) IMPLANT
BLADE SAGITTAL AGGR TOOTH XLG (BLADE) ×1 IMPLANT
BNDG COHESIVE 6X5 TAN ST LF (GAUZE/BANDAGES/DRESSINGS) ×1 IMPLANT
BRUSH SCRUB EZ PLAIN DRY (MISCELLANEOUS) ×1 IMPLANT
CHLORAPREP W/TINT 26 (MISCELLANEOUS) ×1 IMPLANT
DERMABOND ADVANCED .7 DNX12 (GAUZE/BANDAGES/DRESSINGS) ×1 IMPLANT
DRAPE C-ARM XRAY 36X54 (DRAPES) ×1 IMPLANT
DRAPE SHEET LG 3/4 BI-LAMINATE (DRAPES) ×3 IMPLANT
DRAPE TABLE BACK 80X90 (DRAPES) ×1 IMPLANT
DRSG MEPILEX SACRM 8.7X9.8 (GAUZE/BANDAGES/DRESSINGS) ×1 IMPLANT
DRSG OPSITE POSTOP 4X8 (GAUZE/BANDAGES/DRESSINGS) ×1 IMPLANT
ELECT BLADE 4.0 EZ CLEAN MEGAD (MISCELLANEOUS) ×1 IMPLANT
ELECT REM PT RETURN 9FT ADLT (ELECTROSURGICAL) ×1 IMPLANT
ELECTRODE BLDE 4.0 EZ CLN MEGD (MISCELLANEOUS) ×1 IMPLANT
ELECTRODE REM PT RTRN 9FT ADLT (ELECTROSURGICAL) ×1 IMPLANT
GLOVE BIO SURGEON STRL SZ8 (GLOVE) ×1 IMPLANT
GLOVE BIOGEL PI IND STRL 8 (GLOVE) ×1 IMPLANT
GLOVE PI ORTHO PRO STRL 7.5 (GLOVE) ×2 IMPLANT
GLOVE PI ORTHO PRO STRL SZ8 (GLOVE) ×2 IMPLANT
GLOVE SURG SYN 7.5 E (GLOVE) ×1 IMPLANT
GLOVE SURG SYN 7.5 PF PI (GLOVE) ×1 IMPLANT
GOWN SRG XL LVL 3 NONREINFORCE (GOWNS) ×1 IMPLANT
GOWN STRL REUS W/ TWL LRG LVL3 (GOWN DISPOSABLE) ×1 IMPLANT
GOWN STRL REUS W/ TWL XL LVL3 (GOWN DISPOSABLE) ×1 IMPLANT
HEAD CERAMIC FEMORAL 36MM (Head) IMPLANT
HOOD PEEL AWAY T7 (MISCELLANEOUS) ×2 IMPLANT
INSERT 0 DEGREE 36 (Miscellaneous) IMPLANT
IV NS 100ML SINGLE PACK (IV SOLUTION) ×1 IMPLANT
KIT PATIENT CARE HANA TABLE (KITS) ×1 IMPLANT
LIGHT WAVEGUIDE WIDE FLAT (MISCELLANEOUS) ×1 IMPLANT
MANIFOLD NEPTUNE II (INSTRUMENTS) ×1 IMPLANT
MARKER SKIN DUAL TIP RULER LAB (MISCELLANEOUS) ×1 IMPLANT
MAT ABSORB FLUID 56X50 GRAY (MISCELLANEOUS) ×1 IMPLANT
NDL SPNL 20GX3.5 QUINCKE YW (NEEDLE) ×1 IMPLANT
NEEDLE SPNL 20GX3.5 QUINCKE YW (NEEDLE) ×1 IMPLANT
NS IRRIG 500ML POUR BTL (IV SOLUTION) ×1 IMPLANT
PACK HIP COMPR (MISCELLANEOUS) ×1 IMPLANT
PAD ARMBOARD 7.5X6 YLW CONV (MISCELLANEOUS) ×1 IMPLANT
PENCIL SMOKE EVACUATOR (MISCELLANEOUS) ×1 IMPLANT
SCREW HEX LP 6.5X15 (Screw) IMPLANT
SCREW HEX LP 6.5X25 (Screw) IMPLANT
SHELL ACETAB TRIDENT 48 (Shell) IMPLANT
SLEEVE SCD COMPRESS KNEE MED (STOCKING) ×1 IMPLANT
SOLUTION IRRIG SURGIPHOR (IV SOLUTION) ×1 IMPLANT
STEM STD OFFSET SZ3 32.5 (Stem) IMPLANT
SURGIFLO W/THROMBIN 8M KIT (HEMOSTASIS) IMPLANT
SUT BONE WAX W31G (SUTURE) ×1 IMPLANT
SUT ETHIBOND 2 V 37 (SUTURE) ×1 IMPLANT
SUT SILK 0 30XBRD TIE 6 (SUTURE) ×1 IMPLANT
SUT STRATA 1 CT-1 DLB (SUTURE) ×1 IMPLANT
SUT STRATAFIX 14 PDO 48 VLT (SUTURE) ×1 IMPLANT
SUT STRATAFIX PDO 1 14 VIOLET (SUTURE) ×1 IMPLANT
SUT VIC AB 0 CT1 36 (SUTURE) ×1 IMPLANT
SUT VIC AB 2-0 CT2 27 (SUTURE) ×1 IMPLANT
SUTURE STRATA SPIR 4-0 18 (SUTURE) ×1 IMPLANT
SYR 30ML LL (SYRINGE) ×2 IMPLANT
TAPE MICROFOAM 4IN (TAPE) IMPLANT
TOWEL OR 17X26 4PK STRL BLUE (TOWEL DISPOSABLE) IMPLANT
TRAP FLUID SMOKE EVACUATOR (MISCELLANEOUS) ×1 IMPLANT
WAND WEREWOLF FASTSEAL 6.0 (MISCELLANEOUS) ×1 IMPLANT
WATER STERILE IRR 1000ML POUR (IV SOLUTION) ×1 IMPLANT

## 2023-06-03 NOTE — Transfer of Care (Addendum)
 Immediate Anesthesia Transfer of Care Note  Patient: Alice Andrews  Procedure(s) Performed: TOTAL HIP ARTHROPLASTY ANTERIOR APPROACH (Right: Hip)  Patient Location: PACU  Anesthesia Type:MAC and Spinal  Level of Consciousness: awake, alert , and oriented  Airway & Oxygen Therapy: Patient Spontanous Breathing  Post-op Assessment: Report given to RN, Post -op Vital signs reviewed and stable, and Patient moving all extremities  Post vital signs: Reviewed and stable  Last Vitals:  Vitals Value Taken Time  BP 127/82 06/03/23 1223  Temp 36.3 C 06/03/23 1223  Pulse 93 06/03/23 1228  Resp 19 06/03/23 1228  SpO2 98 % 06/03/23 1228  Vitals shown include unfiled device data.  Last Pain:  Vitals:   06/03/23 0913  PainSc: 10-Worst pain ever         Complications: No notable events documented.

## 2023-06-03 NOTE — Evaluation (Signed)
 Physical Therapy Evaluation Patient Details Name: Alice Andrews MRN: [id number] DOB: 11-Aug-1980 Today's Date: 06/03/2023  History of Present Illness  Pt admitted for R THR and is POD 0 at time of evaluation. PMH includes L THR in 2024.  Clinical Impression  Pt is a pleasant 43 year old female who was admitted for R anterior THR. Pt performs bed mobility with supervision, transfers with cga, and ambulation with supervision. Pt demonstrates deficits with strength/mobility/pain. Would benefit from skilled PT to address above deficits and promote optimal return to PLOF. Pt will continue to receive skilled PT services while admitted and will defer to TOC/care team for updates regarding disposition planning. Pt reports the walker delivered to her last time was an adult RW and needs a youth RW. Informed TOC of request.       If plan is discharge home, recommend the following: A little help with walking and/or transfers   Can travel by private vehicle        Equipment Recommendations  (youth RW)  Recommendations for Other Services       Functional Status Assessment Patient has had a recent decline in their functional status and demonstrates the ability to make significant improvements in function in a reasonable and predictable amount of time.     Precautions / Restrictions Precautions Precautions: Fall;Anterior Hip Precaution Booklet Issued: No Recall of Precautions/Restrictions: Intact Restrictions Weight Bearing Restrictions Per Provider Order: Yes RLE Weight Bearing Per Provider Order: Weight bearing as tolerated      Mobility  Bed Mobility Overal bed mobility: Needs Assistance Bed Mobility: Supine to Sit     Supine to sit: Supervision     General bed mobility comments: takes increased time. Once seated at EOB, upright posture noted    Transfers Overall transfer level: Needs assistance Equipment used: Rolling walker (2 wheels) Transfers: Sit to/from Stand Sit to  Stand: Contact guard assist           General transfer comment: safe technique with cues for hand placement. RW used once standing    Ambulation/Gait Ambulation/Gait assistance: Supervision Gait Distance (Feet): 220 Feet Assistive device: Rolling walker (2 wheels) Gait Pattern/deviations: Step-through pattern       General Gait Details: ambulated with ease around RN station with reciprocal gait pattern. No fatigue and minimal pain present  Stairs            Wheelchair Mobility     Tilt Bed    Modified Rankin (Stroke Patients Only)       Balance Overall balance assessment: Mild deficits observed, not formally tested                                           Pertinent Vitals/Pain Pain Assessment Pain Assessment: Faces Faces Pain Scale: Hurts a little bit Pain Location: R hip Pain Descriptors / Indicators: Operative site guarding Pain Intervention(s): Limited activity within patient's tolerance, Premedicated before session, Repositioned    Home Living Family/patient expects to be discharged to:: Private residence Living Arrangements: Children Available Help at Discharge: Family Type of Home: House Home Access: Stairs to enter Entrance Stairs-Rails: Right Entrance Stairs-Number of Steps: 2   Home Layout: One level Home Equipment: BSC/3in1;Cane - single point      Prior Function Prior Level of Function : Independent/Modified Independent;Working/employed;Driving;History of Falls (last six months)  Mobility Comments: works from home, indep prior ADLs Comments: indep     Extremity/Trunk Assessment   Upper Extremity Assessment Upper Extremity Assessment: Overall WFL for tasks assessed    Lower Extremity Assessment Lower Extremity Assessment: Generalized weakness (R LE grossly 4/5; L LE grossly 5/5)       Communication   Communication Communication: No apparent difficulties    Cognition Arousal:  Alert Behavior During Therapy: WFL for tasks assessed/performed   PT - Cognitive impairments: No apparent impairments                       PT - Cognition Comments: pleasant and agreeable to session Following commands: Intact       Cueing Cueing Techniques: Verbal cues     General Comments      Exercises Other Exercises Other Exercises: seated ther-ex performed including LAQ, SLRs, and hip abd/add. 5 reps with supervision   Assessment/Plan    PT Assessment Patient needs continued PT services  PT Problem List Decreased strength;Decreased balance;Decreased mobility;Pain       PT Treatment Interventions DME instruction;Gait training;Stair training;Therapeutic exercise    PT Goals (Current goals can be found in the Care Plan section)  Acute Rehab PT Goals Patient Stated Goal: to go home PT Goal Formulation: With patient Time For Goal Achievement: 06/17/23 Potential to Achieve Goals: Good    Frequency BID     Co-evaluation               AM-PAC PT "6 Clicks" Mobility  Outcome Measure Help needed turning from your back to your side while in a flat bed without using bedrails?: None Help needed moving from lying on your back to sitting on the side of a flat bed without using bedrails?: None Help needed moving to and from a bed to a chair (including a wheelchair)?: None Help needed standing up from a chair using your arms (e.g., wheelchair or bedside chair)?: None Help needed to walk in hospital room?: None Help needed climbing 3-5 steps with a railing? : None 6 Click Score: 24    End of Session Equipment Utilized During Treatment: Gait belt Activity Tolerance: Patient tolerated treatment well Patient left: in bed Nurse Communication: Mobility status PT Visit Diagnosis: Muscle weakness (generalized) (M62.81);Difficulty in walking, not elsewhere classified (R26.2);Pain Pain - Right/Left: Right Pain - part of body: Hip    Time: 9604-5409 PT Time  Calculation (min) (ACUTE ONLY): 21 min   Charges:   PT Evaluation $PT Eval Low Complexity: 1 Low PT Treatments $Gait Training: 8-22 mins PT General Charges $$ ACUTE PT VISIT: 1 Visit         Elizabeth Palau, PT, DPT, GCS [phone]   Reather Steller 06/03/2023, 3:45 PM

## 2023-06-03 NOTE — Op Note (Signed)
 Patient Name: Alice Andrews  ZOX:[id number]  Pre-Operative Diagnosis: Right hip avascular necrosis with collapse  Post-Operative Diagnosis: (same)  Procedure: Right Total Hip Arthroplasty  Components/Implants: Cup: Trident Tritanium clusterhole 48/D w/x2 screws    Liner: neutral X3 poly 36/D  Stem: Insignia #3 std offset  Head:biolox ceramic 36 +24mm   Date of Surgery: 06/03/2023  Surgeon: Reinaldo Berber MD  Assistant: Amador Cunas PA (present and scrubbed throughout the case, critical for assistance with exposure, retraction, instrumentation, and closure), [facility] PAs   Anesthesiologist: Piscitello  Anesthesia: Spinal   EBL: 150cc  IVF:700cc  Complications: None   Brief history: The patient is a 43 year old female with a history of avascular necrosis with collapse of her right hip with pain limiting their range of motion and activities of daily living, which has failed multiple attempts at conservative therapy.  The risks and benefits of total hip arthroplasty as definitive surgical treatment were discussed with the patient, who opted to proceed with the operation.  After outpatient medical clearance and optimization was completed the patient was admitted to [facility] for the procedure.  All preoperative films were reviewed and an appropriate surgical plan was made prior to surgery.   Description of procedure: The patient was brought to the operating room where laterality was confirmed by all those present to be the right side.  The patient was administered spinal anesthesia on a stretcher prior to being moved supine on the operating room table. Patient was given an intravenous dose of antibiotics for surgical prophylaxis and TXA.  All bony prominences and extremities were well padded and the patient was securely attached to the table boots, a perineal post was placed and the patient had a safety strap placed.  Surgical site was prepped with alcohol and  chlorhexidine. The surgical site over the hip was and draped in typical sterile fashion with multiple layers of adhesive and nonadhesive drapes.  The incision site was marked out with a sterile marker and care was taken to assess the position of the ASIS and ensure appropriate position for the incision.    A surgical timeout was then called with participation of all staff in the room the patient was then a confirmed again and laterality confirmed.  Incision was made over the anterior lateral aspect of the proximal thigh in line with the TFL.  Appropriate retractors were placed and all bleeding vessels were coagulated within the subcutaneous and fatty layers.  An incision was made in the TFL fascia in the interval was carefully identified.  The lateral ascending branches of the circumflex vessels were identified, cauterized and carefully dissected. The main vessels were then tied with a 0 silk hand tie.  Retractors were placed around the superior lateral and inferior medial aspects of the femoral neck and a capsulotomy was performed exposing the hip joint.  Retraction stitches were placed and the capsulotomy to assist with visualization.  Femoral neck cut was then made and the femoral head was extracted after placing the leg in traction.  Bone wax was then applied to the proximal cut surface of the femur and aqua mantis was used to address any bleeding around the femoral neck cut.  Retractors were then placed around the acetabulum to fully visualize the joint space, and the remaining labral tissue was removed and pulvinar was removed.   The acetabulum was then sequentially reamed up to the appropriate size in order to get good fit and fill for the acetabular component while under fluoroscopic guidance.  Acetabular component was then placed and malleted into a secure fit while confirming position and abduction angle and anteversion utilizing fluoroscopy.  2 screws were then placed in the acetabular cup to assist  in securing the cup in place. The cup was irrigated,  a real neutral liner was placed, impacted, and checked for stability. The femur traction was dropped and sequentially externally rotated while performing a release of the posterior and superomedial tissues off of the proximal femur to allow for mobility, care was taken to preserve the external rotators and piriformis attachments.  The remaining interval between the abductors and the capsule was dissected out and a retractor was placed over the superolateral aspect of the femur over the greater trochanter.  The leg was carefully brought down into extension and adducted to provide visualization of the proximal femur for broaching.  The femur was then sequentially broached up to an appropriate size which provided for good fill and stability to the femoral broach.  A trial neck and head were placed on the femoral broach and the leg was brought up for reduction.  The hip was reduced and manual check of stability was performed.  The hip was found to be stable in flexion internal rotation and extension external rotation.  Leg lengths were confirmed on fluoroscopy.   The hip was then dislocated the trial neck and head were removed.  The leg was then brought down into extension and adduction in the proximal femur was reexposed.  The broach trial was removed and the femur was irrigated with normal saline prior to the real femoral stem being implanted.  After the femoral stem was seated and shown to have good fit and fill the appropriate head was impacted the leg was brought up and reduced.  There was good range of motion with stability in flexion internal rotation and extension external rotation on testing.  Leg lengths were found to be appropriate on fluoroscopic evaluation at this time.  The hip was then irrigated with betdine based surgiphor solution and then saline solution.  The capsulotomy was repaired with Ethibond sutures.  A pericapsular and peritrochanteric  cocktail with Exparel and bupivacaine was then injected as well as the subcutaneous tissues. The fascia was closed with a #1 barbed running suture.  The deep tissues were closed with Vicryl sutures the subcutaneous tissues were closed with interrupted Vicryl sutures and a running barbed 4-0 suture.  The skin was then reinforced with Dermabond and a sterile dressing was placed.   The patient was awoken from anesthesia transferred off of the operating room table onto a hospital bed where examination of leg lengths found the leg lengths to be equal with a good distal pulse.  The patient was then transferred to the PACU in stable condition.

## 2023-06-03 NOTE — Plan of Care (Signed)
Reviewed plan of care with patient, verbalizes understanding

## 2023-06-03 NOTE — Discharge Instructions (Addendum)
 [facility] They will call you to set up when they are coming out to see you   [address], [place], [place] [address] Hours:  Open ? Closes 5?PM Phone: [phone]     Instructions after Anterior Total Hip Replacement        Dr. Regenia Skeeter., M.D.      Dept. of Orthopaedics & Sports Medicine  [facility]  [address]  [place], [place]  [address]  Phone: [phone]   Fax: [phone]    DIET: Drink plenty of non-alcoholic fluids. Resume your normal diet. Include foods high in fiber.  ACTIVITY:  You may use crutches or a walker with weight-bearing as tolerated, unless instructed otherwise. You may be weaned off of the walker or crutches by your Physical Therapist.  Continue doing gentle exercises. Exercising will reduce the pain and swelling, increase motion, and prevent muscle weakness.   Please continue to use the TED compression stockings for 2 weeks. You may remove the stockings at night, but should reapply them in the morning. Do not drive or operate any equipment until instructed.  WOUND CARE:  Continue to use ice packs periodically to reduce pain and swelling. You may shower with honeycomb dressing 3 days after your surgery. Do not submerge incision site under water. Remove honeycomb dressing 7 days after surgery and allow dermabond to fall off on its own.   MEDICATIONS: You may resume your regular medications. Please take the pain medication as prescribed on the medication list. Do not take pain medication on an empty stomach. You have been given a prescription for a blood thinner to prevent blood clots. Please take the medication as instructed. (NOTE: After completing a 2 week course of Lovenox, take one Enteric-coated 81 mg aspirin twice a day for 3 additional weeks.) Pain medications and iron supplements can cause constipation. Use a stool softener (Senokot or Colace) on a daily basis and a laxative (dulcolax or miralax) as needed. Do not  drive or drink alcoholic beverages when taking pain medications.  POSTOPERATIVE CONSTIPATION PROTOCOL Constipation - defined medically as fewer than three stools per week and severe constipation as less than one stool per week.  One of the most common issues patients have following surgery is constipation.  Even if you have a regular bowel pattern at home, your normal regimen is likely to be disrupted due to multiple reasons following surgery.  Combination of anesthesia, postoperative narcotics, change in appetite and fluid intake all can affect your bowels.  In order to avoid complications following surgery, here are some recommendations in order to help you during your recovery period.  Colace (docusate) - Pick up an over-the-counter form of Colace or another stool softener and take twice a day as long as you are requiring postoperative pain medications.  Take with a full glass of water daily.  If you experience loose stools or diarrhea, hold the colace until you stool forms back up.  If your symptoms do not get better within 1 week or if they get worse, check with your doctor.  Dulcolax (bisacodyl) - Pick up over-the-counter and take as directed by the product packaging as needed to assist with the movement of your bowels.  Take with a full glass of water.  Use this product as needed if not relieved by Colace only.   MiraLax (polyethylene glycol) - Pick up over-the-counter to have on hand.  MiraLax is a solution that will increase the amount of water in your bowels to assist  with bowel movements.  Take as directed and can mix with a glass of water, juice, soda, coffee, or tea.  Take if you go more than two days without a movement. Do not use MiraLax more than once per day. Call your doctor if you are still constipated or irregular after using this medication for 7 days in a row.  If you continue to have problems with postoperative constipation, please contact the office for further assistance and  recommendations.  If you experience "the worst abdominal pain ever" or develop nausea or vomiting, please contact the office immediatly for further recommendations for treatment.   CALL THE OFFICE FOR: Temperature above 101 degrees Excessive bleeding or drainage on the dressing. Excessive swelling, coldness, or paleness of the toes. Persistent nausea and vomiting.  FOLLOW-UP:  You should have an appointment to return to the office in 2 weeks after surgery. Arrangements have been made for continuation of Physical Therapy (either home therapy or outpatient therapy).

## 2023-06-03 NOTE — Anesthesia Procedure Notes (Addendum)
 Spinal  Patient location during procedure: OR Start time: 06/03/2023 10:22 AM End time: 06/03/2023 10:29 AM Reason for block: surgical anesthesia Staffing Performed: other anesthesia staff  Resident/CRNA: Otho Perl, CRNA Other anesthesia staff: Pricilla Handler, RN Performed by: Pricilla Handler, RN Authorized by: Rosaria Ferries, MD   Preanesthetic Checklist Completed: patient identified, IV checked, site marked, risks and benefits discussed, surgical consent, monitors and equipment checked, pre-op evaluation and timeout performed Spinal Block Patient position: sitting Prep: DuraPrep Patient monitoring: heart rate, cardiac monitor, continuous pulse ox and blood pressure Approach: midline Location: L3-4 Injection technique: single-shot Needle Needle type: Sprotte  Needle gauge: 24 G Needle length: 9 cm Assessment Sensory level: T4 Events: CSF return Additional Notes Preformed by SRNA, CRNA present for procedure. Spinal kit lot number [id number]; expiration date 10/06/2024

## 2023-06-03 NOTE — Interval H&P Note (Signed)
 Patient history and physical updated. Consent reviewed including risks, benefits, and alternatives to surgery. Patient agrees with above plan to proceed with right anterior total hip arthroplasty.

## 2023-06-03 NOTE — Anesthesia Preprocedure Evaluation (Signed)
 Anesthesia Evaluation  Patient identified by MRN, date of birth, ID band Patient awake    Reviewed: Allergy & Precautions, NPO status , Patient's Chart, lab work & pertinent test results  History of Anesthesia Complications Negative for: history of anesthetic complications  Airway Mallampati: III  TM Distance: >3 FB Neck ROM: full    Dental  (+) Chipped   Pulmonary neg pulmonary ROS, neg shortness of breath, former smoker   Pulmonary exam normal        Cardiovascular Exercise Tolerance: Good (-) angina Normal cardiovascular exam     Neuro/Psych  Headaches  negative psych ROS   GI/Hepatic negative GI ROS, Neg liver ROS,neg GERD  ,,  Endo/Other  negative endocrine ROS    Renal/GU      Musculoskeletal   Abdominal   Peds  Hematology negative hematology ROS (+)   Anesthesia Other Findings Past Medical History: No date: Anemia No date: Arthritis No date: Avascular necrosis of bone of hip (HCC)     Comment:  bilateral No date: Chronic pain of both knees No date: Former heavy tobacco smoker No date: H/O migraine No date: Headache No date: Hip pain No date: Insomnia No date: Osteoarthritis of left hip No date: Pelvic mass No date: Right ovarian cyst No date: Vitamin B12 deficiency No date: Vitamin D deficiency  Past Surgical History: No date: ABDOMINAL HYSTERECTOMY No date: CESAREAN SECTION     Comment:  x4 2000: KIDNEY SURGERY     Comment:  stent placed and then removed.  07/07/2021: KNEE ARTHROSCOPY WITH LATERAL MENISECTOMY; Left     Comment:  Procedure: Left knee arthroscopy with lateral femoral               microfracture, medial femoral chondroplasty;  Surgeon:               Signa Kell, MD;  Location: [facility];                Service: Orthopedics;  Laterality: Left; 04/2022: ROBOTIC RESECTION OF LEFT PELVIC MASS      Comment:  [facility] 02/18/2023: TOTAL HIP ARTHROPLASTY; Left     Comment:   Procedure: TOTAL HIP ARTHROPLASTY ANTERIOR APPROACH;                Surgeon: Reinaldo Berber, MD;  Location: [facility];                Service: Orthopedics;  Laterality: Left;  BMI    Body Mass Index: 33.33 kg/m      Reproductive/Obstetrics negative OB ROS                             Anesthesia Physical Anesthesia Plan  ASA: 2  Anesthesia Plan: Spinal   Post-op Pain Management:    Induction:   PONV Risk Score and Plan:   Airway Management Planned: Natural Airway and Nasal Cannula  Additional Equipment:   Intra-op Plan:   Post-operative Plan:   Informed Consent: I have reviewed the patients History and Physical, chart, labs and discussed the procedure including the risks, benefits and alternatives for the proposed anesthesia with the patient or authorized representative who has indicated his/her understanding and acceptance.     Dental Advisory Given  Plan Discussed with: Anesthesiologist, CRNA and Surgeon  Anesthesia Plan Comments: (Patient reports no bleeding problems and no anticoagulant use.  Plan for spinal with backup GA  Patient consented for risks of anesthesia including but  not limited to:  - adverse reactions to medications - damage to eyes, teeth, lips or other oral mucosa - nerve damage due to positioning  - risk of bleeding, infection and or nerve damage from spinal that could lead to paralysis - risk of headache or failed spinal - damage to teeth, lips or other oral mucosa - sore throat or hoarseness - damage to heart, brain, nerves, lungs, other parts of body or loss of life  Patient voiced understanding and assent.)       Anesthesia Quick Evaluation

## 2023-06-03 NOTE — H&P (Signed)
 History of Present Illness: Alice Andrews is an 43 y.o. female presents for follow-up evaluation of her right hip. The patient has ongoing severe pain in her right groin and right thigh worse with ambulation requiring use of a cane for her right leg. She reports her left hip is improved and she does not have any significant pain or limitations now from her left hip joint. She reports her right hip causes pain up to a 10 out of 10 and is limiting her further progress. She is here today to discuss surgical intervention for her right hip. The patient denies fevers, chills, numbness, tingling, shortness of breath, chest pain, recent illness, or any trauma.  Patient is a non-smoker nondiabetic with a BMI of 31.6  Past Medical History: Past Medical History:  Diagnosis Date  Abdominal pain, generalized 02/08/2022  Acute blood loss anemia 06/27/2016  Chronic pain 2017  Has followed with ortho - started as L leg but now both legs.  Insomnia 12/02/2018  Macrocytic anemia 08/30/2014  [x]  Ferritin, TIBC, iron, hgb electrophoresis, B12, folate, smear sent 2/8 - All normal except ferritin (8.5) [ ]  Ensure taking iron, started 2/8 2017-2018 pregnancy: 04/08/16: Hct 35.1, Hgb 11.7, MCV 100.8. 04/19/16: Hct 32.2, Hgb 10.6, MCV 103.6. Mild anemia with elevated MCV. 2016 pregnancy: 08/26/14: Hct: 32.2, Hgb: 10.9, MCV: 101.5. Mild anemia with elevated MCV. 5/19/6 Folate: 8.8, vitam  Migraines  Pelvic fluid collection 02/08/2022  Pelvic mass in female 03/26/2022   Past Surgical History: Past Surgical History:  Procedure Laterality Date  Left knee arthroscopic lateral femoral condyle microfracture, left knee arthroscopic chondroplasty od medical femoral condyle Left 07/07/2021  Dr. Allena Katz  ARTHROPLASTY HIP TOTAL Left 02/18/2023  Anterior by Dr. Dr. Audelia Acton  CESAREAN SECTION  CESAREAN SECTION  CESAREAN SECTION  CESAREAN SECTION   Past Family History: Family History  Problem Relation Age of Onset   Stroke Mother  Lupus Mother  Diabetes type I Mother  Colon cancer Father  No Known Problems Brother  Cancer Maternal Grandmother  No Known Problems Maternal Grandfather  Cancer Paternal Grandmother  Cancer Paternal Grandfather  No Known Problems Brother   Medications: Current Outpatient Medications  Medication Sig Dispense Refill  acetaminophen (TYLENOL) 325 MG tablet Take by mouth as needed  cholecalciferol (VITAMIN D3) 5,000 unit capsule Take 1 capsule (5,000 Units total) by mouth once daily for Vitamin D Deficiency. 360 capsule 11  methocarbamoL (ROBAXIN) 500 MG tablet Take 1 tablet (500 mg total) by mouth 4 (four) times daily 60 tablet 0   No current facility-administered medications for this visit.   Allergies: No Known Allergies   Visit Vitals: Vitals:  05/21/23 0846  BP: 126/70    Review of Systems:  A comprehensive 14 point ROS was performed, reviewed, and the pertinent orthopaedic findings are documented in the HPI.  Physical Exam: Body mass index is 31.64 kg/m. General/Constitutional: No apparent distress: well-nourished and well developed. Pulmonary exam: Lungs clear to auscultation bilaterally no wheezing rales or rhonchi Cardiac exam: Regular rate and rhythm no obvious murmurs rubs or gallops. Lymphatic: No palpable adenopathy. Vascular: No edema, swelling or tenderness, except as noted in detailed exam. Integumentary: No impressive skin lesions present, except as noted in detailed exam. Neuro/Psych: Normal mood and affect, oriented to person, place and time. Musculoskeletal: Normal, except as noted in detailed exam and in HPI.  Right hip exam  SKIN: intact SWELLING: none WARMTH: no warmth TENDERNESS: none, Stinchfield Positive ROM: 0 degrees internal rotation and 20 degrees external rotation  and pain with internal rotation,; Hip Flexion 80 STRENGTH: limited by pain GAIT: antalgic on the right STABILITY: stable to testing CREPITUS: yes LEG LENGTH  DISCREPANCY: left longer by .5 cm NEUROLOGICAL EXAM: normal VASCULAR EXAM: normal LUMBAR SPINE: tenderness: no straight leg raising sign: no motor exam: normal  The contralateral hip was examined for comparison and it showed: TENDERNESS: none ROM: normal and full STRENGTH: normal STABILITY: stable to testing  Hip Imaging :  I reviewed AP pelvis and lateral x-rays of the right hip taken at the previous office visit which show redemonstration of femoral head collapse consistent with avascular necrosis with cystic changes and osteophyte formation. Grossly unchanged from prior films. Left hip status post total hip arthroplasty components in appropriate position with no evidence of fracture.  Assessment:  Right hip avascular necrosis with collapse  Plan: Alice Andrews is a 43 year old female who presents with right hip avascular necrosis with collapse which is functionally limiting her on a daily basis. She has done well with total hip replacement in the left side and is here today to move forward with plans for a right hip replacement. Based upon the patient's continued symptoms and failure to respond to conservative treatment, I have recommended a right total hip replacement for this patient. A long discussion took place with the patient describing what a total joint replacement is and what the procedure would entail. A hip model, similar to the implants that will be used during the operation, was utilized to demonstrate the implants. Choices of implant manufactures were discussed and reviewed. The ability to secure the implant utilizing cement or cementless (press fit) fixation was discussed. Anterior and posterior exposures were discussed. For this patient an appropriate approach will be anterior.  The hospitalization and post-operative care and rehabilitation were also discussed. The use of perioperative antibiotics and DVT prophylaxis were discussed. The risk, benefits and alternatives to a  surgical intervention were discussed at length with the patient. The patient was also advised of risks related to the medical comorbidities and elevated body mass index (BMI). A lengthy discussion took place to review the most common complications including but not limited to: deep vein thrombosis, pulmonary embolus, heart attack, stroke, infection, wound breakdown, heterotopic ossification, dislocation, numbness, leg length in-equality, intraoperative fracture, damage to nerves, tendon,muscles, arteries or other blood vessels, death and other possible complications from anesthesia. The patient was told that we will take steps to minimize these risks by using sterile technique, antibiotics and DVT prophylaxis when appropriate and follow the patient postoperatively in the office setting to monitor progress. The possibility of recurrent pain, no improvement in pain and actual worsening of pain were also discussed with the patient. The risk of dislocation following total hip replacement was discussed and potential precautions to prevent dislocation were reviewed. We reviewed her increased risk of need for revision during her life given her young age.  Patient asked about and confirms no history of any reactions to metal or metal allergy in the past.  The discharge plan of care focused on the patient going home following surgery. The patient was encouraged to make the necessary arrangements to have someone stay with them when they are discharged home.   The benefits of surgery were discussed with the patient including the potential for improving the patient's current clinical condition through operative intervention. Alternatives to surgical intervention including continued conservative management were also discussed in detail. All questions were answered to the satisfaction of the patient. The patient participated and agreed to the  plan of care as well as the use of the recommended implants for their total hip  replacement surgery. An information packet was given to the patient to review prior to surgery.   Has recent clearance for surgery and recent labs. All questions answered patient agrees with the above plan to move forward with right anterior total replacement.  Portions of this record have been created using Scientist, clinical (histocompatibility and immunogenetics). Dictation errors have been sought, but may not have been identified and corrected.  Reinaldo Berber MD

## 2023-06-03 NOTE — TOC Initial Note (Signed)
 Transition of Care [facility]) - Initial/Assessment Note    Patient Details  Name: Alice Andrews MRN: [id number] Date of Birth: 25-Jun-1980  Transition of [facility] CM/SW Contact:    Marlowe Sax, RN Phone Number: 06/03/2023, 3:32 PM  Clinical Narrative:                                                    [facility] Is set up for [facility] prior to surgery by Surgeons office         Patient Goals and CMS Choice            Expected Discharge Plan and Services                                              Prior Living Arrangements/Services                       Activities of Daily Living   ADL Screening (condition at time of admission) Independently performs ADLs?: Yes (appropriate for developmental age) Is the patient deaf or have difficulty hearing?: No Does the patient have difficulty seeing, even when wearing glasses/contacts?: No Does the patient have difficulty concentrating, remembering, or making decisions?: No  Permission Sought/Granted                  Emotional Assessment              Admission diagnosis:  S/P total right hip arthroplasty [W09.811] Patient Active Problem List   Diagnosis Date Noted   S/P total right hip arthroplasty 06/03/2023   S/P total left hip arthroplasty 02/18/2023   Bilateral primary osteoarthritis of knee 11/29/2020   Primary osteoarthritis of right knee 11/29/2020   Primary osteoarthritis of left knee 11/29/2020   Chronic pain of both knees 11/29/2020   PCP:  Marina Goodell, MD Pharmacy:   [facility] [phone] - [place], [place] - [address] [place] [address] Phone: [phone] Fax: [phone]  [facility] [address], [place] - [address] [place] [address] Phone: [phone] Fax: [phone]  [facility] [address], [place] - [address] [place] [address] Phone: [phone] Fax:  [phone]     Social Drivers of Health (SDOH) Social History: SDOH Screenings   Food Insecurity: No Food Insecurity (06/03/2023)  Housing: Low Risk  (06/03/2023)  Transportation Needs: No Transportation Needs (06/03/2023)  Utilities: Not At Risk (06/03/2023)  Financial Resource Strain: Low Risk  (02/21/2022)   Received from [facility], [facility]  Physical Activity: Inactive (02/21/2022)   Received from [facility], [facility]  Social Connections: Socially Integrated (02/21/2022)   Received from [facility], [facility]  Stress: No Stress Concern Present (05/01/2022)   Received from [facility], [facility]  Tobacco Use: Medium Risk (06/03/2023)   SDOH Interventions:     Readmission Risk Interventions     No data to display

## 2023-06-03 NOTE — Progress Notes (Addendum)
 Patient is not able to walk the distance required to go the bathroom, or he/she is unable to safely negotiate stairs required to access the bathroom.  A 3in1 BSC will alleviate this problem

## 2023-06-03 NOTE — Discharge Summary (Signed)
 Physician Discharge Summary  Patient ID: Alice Andrews MRN: [id number] DOB/AGE: May 07, 1980 43 y.o.  Admit date: 06/03/2023 Discharge date: 06/04/2023  Admission Diagnoses:  S/P total right hip arthroplasty [F62.130]   Discharge Diagnoses: Patient Active Problem List   Diagnosis Date Noted   S/P total right hip arthroplasty 06/03/2023   S/P total left hip arthroplasty 02/18/2023   Bilateral primary osteoarthritis of knee 11/29/2020   Primary osteoarthritis of right knee 11/29/2020   Primary osteoarthritis of left knee 11/29/2020   Chronic pain of both knees 11/29/2020    Past Medical History:  Diagnosis Date   Anemia    Arthritis    Avascular necrosis of bone of hip (HCC)    bilateral   Chronic pain of both knees    Former heavy tobacco smoker    H/O migraine    Headache    Hip pain    Insomnia    Osteoarthritis of left hip    Pelvic mass    Right ovarian cyst    Vitamin B12 deficiency    Vitamin D deficiency      Transfusion: none   Consultants (if any):   Discharged Condition: Improved  Hospital Course: Alice Andrews is an 43 y.o. female who was admitted 06/03/2023 with a diagnosis of S/P total right hip arthroplasty and went to the operating room on 06/03/2023 and underwent the above named procedures.    Surgeries: Procedure(s): TOTAL HIP ARTHROPLASTY ANTERIOR APPROACH on 06/03/2023 Patient tolerated the surgery well. Taken to PACU where she was stabilized and then transferred to the orthopedic floor.  Started on Lovenox 40 mg q 24 hrs. TEDs and SCDs applied bilaterally. Heels elevated on bed. No evidence of DVT. Negative Homan. Physical therapy started on day #1 for gait training and transfer. OT started day #1 for ADL and assisted devices.  Patient's IV was d/c on day #1. Patient was able to safely and independently complete all PT goals. PT recommending discharge to home.    On post op day #1 patient was stable and ready for discharge to home  with HHPT.  Implants: Cup: Trident Tritanium clusterhole 48/D w/x2 screws    Liner: neutral X3 poly 36/D  Stem: Insignia #3 std offset  Head:biolox ceramic 36 +61mm   She was given perioperative antibiotics:  Anti-infectives (From admission, onward)    Start     Dose/Rate Route Frequency Ordered Stop   06/03/23 1630  ceFAZolin (ANCEF) IVPB 2g/100 mL premix        2 g 200 mL/hr over 30 Minutes Intravenous Every 6 hours 06/03/23 1353 06/04/23 0429   06/03/23 0900  ceFAZolin (ANCEF) IVPB 2g/100 mL premix        2 g 200 mL/hr over 30 Minutes Intravenous On call to O.R. 06/03/23 [id number] 06/03/23 1043     .  She was given sequential compression devices, early ambulation, and Lovenox TEDs for DVT prophylaxis.  She benefited maximally from the hospital stay and there were no complications.    Recent vital signs:  Vitals:   06/03/23 1408 06/03/23 1604  BP: (!) 113/59 (!) 116/59  Pulse: 81 72  Resp:    Temp: 98.1 F (36.7 C) 98.3 F (36.8 C)  SpO2: 97% 97%    Recent laboratory studies:  Lab Results  Component Value Date   HGB 11.9 (L) 05/28/2023   HGB 10.2 (L) 02/19/2023   HGB 12.9 02/06/2023   Lab Results  Component Value Date   WBC 12.9 (H) 05/28/2023  PLT 299 05/28/2023   Lab Results  Component Value Date   INR 1.0 08/22/2012   Lab Results  Component Value Date   NA 136 05/28/2023   K 3.4 (L) 05/28/2023   CL 102 05/28/2023   CO2 23 05/28/2023   BUN 6 05/28/2023   CREATININE 0.75 05/28/2023   GLUCOSE 110 (H) 05/28/2023    Discharge Medications:   Allergies as of 06/03/2023   No Known Allergies      Medication List     TAKE these medications    acetaminophen 500 MG tablet Commonly known as: TYLENOL Take 2 tablets (1,000 mg total) by mouth every 8 (eight) hours.   celecoxib 200 MG capsule Commonly known as: CeleBREX Take 1 capsule (200 mg total) by mouth 2 (two) times daily for 14 days.   cephALEXin 500 MG capsule Commonly known as: KEFLEX Take 1  capsule (500 mg total) by mouth 2 (two) times daily for 5 days. Increase WATER intake while taking this medication.   docusate sodium 100 MG capsule Commonly known as: COLACE Take 1 capsule (100 mg total) by mouth 2 (two) times daily.   enoxaparin 40 MG/0.4ML injection Commonly known as: LOVENOX Inject 0.4 mLs (40 mg total) into the skin daily for 14 days. Start taking on: June 04, 2023   fluconazole 150 MG tablet Commonly known as: Diflucan Take 1 tablet (150 mg) PO x 1 dose. May repeat 150 mg dose in 3 days if still symptomatic.   ipratropium 0.06 % nasal spray Commonly known as: ATROVENT Place 2 sprays into both nostrils 4 (four) times daily.   ondansetron 4 MG tablet Commonly known as: ZOFRAN Take 1 tablet (4 mg total) by mouth every 6 (six) hours as needed for nausea.   oxyCODONE 5 MG immediate release tablet Commonly known as: Roxicodone Take 0.5-1 tablets (2.5-5 mg total) by mouth every 8 (eight) hours as needed for breakthrough pain.   traMADol 50 MG tablet Commonly known as: ULTRAM Take 1 tablet (50 mg total) by mouth every 6 (six) hours as needed for moderate pain (pain score 4-6).   Vitamin D3 Ultra Strength 125 MCG (5000 UT) capsule Generic drug: Cholecalciferol Take 5,000 Units by mouth daily.               Durable Medical Equipment  (From admission, onward)           Start     Ordered   06/03/23 1551  For home use only DME Bedside commode  Once       Question:  Patient needs a bedside commode to treat with the following condition  Answer:  Impaired mobility   06/03/23 1550   06/03/23 1551  For home use only DME Walker rolling  Once       Comments: youth  Question Answer Comment  Walker: With 5 Inch Wheels   Patient needs a walker to treat with the following condition Impaired mobility      06/03/23 1550            Diagnostic Studies: DG HIP UNILAT WITH PELVIS 2-3 VIEWS RIGHT Result Date: 06/03/2023 CLINICAL DATA:  Right hip  arthroplasty. EXAM: DG HIP (WITH OR WITHOUT PELVIS) 2-3V RIGHT COMPARISON:  None Available. FINDINGS: Three intraoperative fluoroscopic spot images provided. The total fluoroscopic time is 14 seconds with cumulative air kerma 1.35 mGy. Bilateral hip arthroplasties noted. IMPRESSION: Intraoperative fluoroscopic spot images. Electronically Signed   By: Elgie Collard M.D.   On: 06/03/2023 15:44  DG C-Arm 1-60 Min-No Report Result Date: 06/03/2023 Fluoroscopy was utilized by the requesting physician.  No radiographic interpretation.   DG C-Arm 1-60 Min-No Report Result Date: 06/03/2023 Fluoroscopy was utilized by the requesting physician.  No radiographic interpretation.    Disposition:      Follow-up Information     Evon Slack, PA-C Follow up in 2 week(s).   Specialties: Orthopedic Surgery, Emergency Medicine Contact information: [address] [place] [address] [phone]                  Signed: Evon Slack 06/03/2023, 4:17 PM

## 2023-06-04 ENCOUNTER — Encounter: Payer: Self-pay | Admitting: Orthopedic Surgery

## 2023-06-04 DIAGNOSIS — M87851 Other osteonecrosis, right femur: Secondary | ICD-10-CM | POA: Diagnosis not present

## 2023-06-04 LAB — BASIC METABOLIC PANEL
Anion gap: 11 (ref 5–15)
BUN: 6 mg/dL (ref 6–20)
CO2: 24 mmol/L (ref 22–32)
Calcium: 8.5 mg/dL — ABNORMAL LOW (ref 8.9–10.3)
Chloride: 100 mmol/L (ref 98–111)
Creatinine, Ser: 0.6 mg/dL (ref 0.44–1.00)
GFR, Estimated: >60 mL/min (ref >60)
Glucose, Bld: 115 mg/dL — ABNORMAL HIGH (ref 70–99)
Potassium: 3.2 mmol/L — ABNORMAL LOW (ref 3.5–5.1)
Sodium: 135 mmol/L (ref 135–145)

## 2023-06-04 LAB — CBC
HCT: 26.8 % — ABNORMAL LOW (ref 36.0–46.0)
Hemoglobin: 9.5 g/dL — ABNORMAL LOW (ref 12.0–15.0)
MCH: 33.3 pg (ref 26.0–34.0)
MCHC: 35.4 g/dL (ref 30.0–36.0)
MCV: 94 fL (ref 80.0–100.0)
Platelets: 439 10*3/uL — ABNORMAL HIGH (ref 150–400)
RBC: 2.85 MIL/uL — ABNORMAL LOW (ref 3.87–5.11)
RDW: 12.2 % (ref 11.5–15.5)
WBC: 16.5 10*3/uL — ABNORMAL HIGH (ref 4.0–10.5)
nRBC: 0 % (ref 0.0–0.2)

## 2023-06-04 MED ORDER — POTASSIUM CHLORIDE 20 MEQ PO PACK
20.0000 meq | PACK | Freq: Two times a day (BID) | ORAL | Status: DC
  Administered 2023-06-04: 20 meq via ORAL
  Filled 2023-06-04 (×2): qty 1

## 2023-06-04 MED ORDER — FE FUM-VIT C-VIT B12-FA 460-60-0.01-1 MG PO CAPS: 1.0000 | ORAL_CAPSULE | Freq: Two times a day (BID) | ORAL | 0 refills | Status: AC

## 2023-06-04 MED ORDER — FE FUM-VIT C-VIT B12-FA 460-60-0.01-1 MG PO CAPS
1.0000 | ORAL_CAPSULE | Freq: Every day | ORAL | Status: DC
  Administered 2023-06-04: 1 via ORAL
  Filled 2023-06-04: qty 1

## 2023-06-04 MED ORDER — POTASSIUM CHLORIDE 20 MEQ PO PACK
20.0000 meq | PACK | Freq: Two times a day (BID) | ORAL | 0 refills | Status: AC
Start: 2023-06-04 — End: 2023-06-07

## 2023-06-04 MED ORDER — TRIAMTERENE-HCTZ 37.5-25 MG PO TABS
ORAL_TABLET | ORAL | Status: AC
  Filled 2023-06-04: qty 1

## 2023-06-04 NOTE — Progress Notes (Signed)
   Subjective: 1 Day Post-Op Procedure(s) (LRB): TOTAL HIP ARTHROPLASTY ANTERIOR APPROACH (Right) Patient reports pain as mild.   Patient is well, and has had no acute complaints or problems Denies any CP, SOB, ABD pain. We will continue therapy today.  Plan is to go Home after hospital stay.  Objective: Vital signs in last 24 hours: Temp:  [97.2 F (36.2 C)-98.3 F (36.8 C)] 97.8 F (36.6 C) (02/25 0748) Pulse Rate:  [68-118] 99 (02/25 0748) Resp:  [13-20] 15 (02/25 0748) BP: (105-127)/(59-90) 127/76 (02/25 0748) SpO2:  [95 %-100 %] 100 % (02/25 0748) Weight:  [74.8 kg] 74.8 kg (02/24 1327)  Intake/Output from previous day: 02/24 0701 - 02/25 0700 In: 1989.1 [P.O.:325; I.V.:1166.5; IV Piggyback:497.5] Out: 1550 [Urine:1400; Blood:150] Intake/Output this shift: No intake/output data recorded.  Recent Labs    06/04/23 0548  HGB 9.5*   Recent Labs    06/04/23 0548  WBC 16.5*  RBC 2.85*  HCT 26.8*  PLT 439*   Recent Labs    06/04/23 0548  NA 135  K 3.2*  CL 100  CO2 24  BUN 6  CREATININE 0.60  GLUCOSE 115*  CALCIUM 8.5*   No results for input(s): "LABPT", "INR" in the last 72 hours.  EXAM General - Patient is Alert, Appropriate, and Oriented Extremity - Neurovascular intact Sensation intact distally Intact pulses distally Dorsiflexion/Plantar flexion intact No cellulitis present Compartment soft Dressing - dressing C/D/I and no drainage Motor Function - intact, moving foot and toes well on exam.   Past Medical History:  Diagnosis Date   Anemia    Arthritis    Avascular necrosis of bone of hip (HCC)    bilateral   Chronic pain of both knees    Former heavy tobacco smoker    H/O migraine    Headache    Hip pain    Insomnia    Osteoarthritis of left hip    Pelvic mass    Right ovarian cyst    Vitamin B12 deficiency    Vitamin D deficiency     Assessment/Plan:   1 Day Post-Op Procedure(s) (LRB): TOTAL HIP ARTHROPLASTY ANTERIOR APPROACH  (Right) Principal Problem:   S/P total right hip arthroplasty  Estimated body mass index is 33.33 kg/m as calculated from the following:   Height as of this encounter: 4\' 11"  (1.499 m).   Weight as of this encounter: 74.8 kg. Advance diet Up with therapy Pain well controlled Hgb 9.5, start Fe supplement K 3.4, start Oral K supplement CM to assist with discharge to home with HHPT today  DVT Prophylaxis - Lovenox, TED hose, and SCDS Weight-Bearing as tolerated to right leg   T. Cranston Neighbor, PA-C [facility] 06/04/2023, 8:19 AM

## 2023-06-04 NOTE — Progress Notes (Signed)
 DISCHARGE NOTE:  Pt dc with IV removed and dc instructions given. Pt has both TED hose on and in place. Pt received 3 in 1 to hospital room. Pt wheeled down to medical mall entrance by staff and transportation provided via family friend.

## 2023-06-04 NOTE — Plan of Care (Signed)
  Problem: Education: Goal: Knowledge of General Education information will improve Description: Including pain rating scale, medication(s)/side effects and non-pharmacologic comfort measures Outcome: Progressing   Problem: Education: Goal: Knowledge of the prescribed therapeutic regimen will improve Outcome: Progressing   Problem: Activity: Goal: Ability to avoid complications of mobility impairment will improve Outcome: Progressing   Problem: Pain Management: Goal: Pain level will decrease with appropriate interventions Outcome: Progressing   Problem: Skin Integrity: Goal: Will show signs of wound healing Outcome: Progressing

## 2023-06-04 NOTE — Anesthesia Postprocedure Evaluation (Signed)
 Anesthesia Post Note  Patient: Alice Andrews  Procedure(s) Performed: TOTAL HIP ARTHROPLASTY ANTERIOR APPROACH (Right: Hip)  Patient location during evaluation: Nursing Unit Anesthesia Type: Spinal Level of consciousness: oriented and awake and alert Pain management: pain level controlled Vital Signs Assessment: post-procedure vital signs reviewed and stable Respiratory status: spontaneous breathing and respiratory function stable Cardiovascular status: blood pressure returned to baseline and stable Postop Assessment: no headache, no backache, no apparent nausea or vomiting and patient able to bend at knees Anesthetic complications: no  No notable events documented.   Last Vitals:  Vitals:   06/04/23 0414 06/04/23 0748  BP: 111/85 127/76  Pulse: 99 99  Resp: 18 15  Temp: (!) 36.4 C 36.6 C  SpO2: 100% 100%    Last Pain:  Vitals:   06/04/23 0748  TempSrc: Oral  PainSc:                  Elmarie Mainland

## 2023-06-04 NOTE — Progress Notes (Signed)
 Physical Therapy Treatment Patient Details Name: Alice Andrews MRN: [id number] DOB: [birth year] Today's Date: 06/04/2023   History of Present Illness Pt admitted for R THR and is POD 0 at time of evaluation. PMH includes L THR in 2024.    PT Comments  Pt is making good progress with PT and has passed all PT goals for safe home discharge. Able to ambulate in hallway with safe technique, perform stair training, and perform written HEP. Will sign off at this time.    If plan is discharge home, recommend the following: A little help with walking and/or transfers   Can travel by private vehicle        Equipment Recommendations   (received youth RW at bedside)    Recommendations for Other Services       Precautions / Restrictions Precautions Precautions: Fall;Anterior Hip Precaution Booklet Issued: Yes (comment) Recall of Precautions/Restrictions: Intact Restrictions Weight Bearing Restrictions Per Provider Order: Yes RLE Weight Bearing Per Provider Order: Weight bearing as tolerated     Mobility  Bed Mobility Overal bed mobility: Modified Independent Bed Mobility: Supine to Sit, Sit to Supine     Supine to sit: Modified independent (Device/Increase time) Sit to supine: Modified independent (Device/Increase time)   General bed mobility comments: safe technique    Transfers Overall transfer level: Modified independent Equipment used: Rolling walker (2 wheels) Transfers: Sit to/from Stand Sit to Stand: Modified independent (Device/Increase time)           General transfer comment: safe technique with upright posture.    Ambulation/Gait Ambulation/Gait assistance: Modified independent (Device/Increase time) Gait Distance (Feet): 250 Feet Assistive device: Rolling walker (2 wheels) Gait Pattern/deviations: Step-through pattern       General Gait Details: ambulated around RN station with reciprocal gait pattern and safe technique   Stairs Stairs:  Yes Stairs assistance: Supervision Stair Management: One rail Right, Step to pattern Number of Stairs: 4 General stair comments: up/down with safe technique with step to gait pattern   Wheelchair Mobility     Tilt Bed    Modified Rankin (Stroke Patients Only)       Balance Overall balance assessment: Mild deficits observed, not formally tested                                          Communication Communication Communication: No apparent difficulties  Cognition Arousal: Alert Behavior During Therapy: WFL for tasks assessed/performed   PT - Cognitive impairments: No apparent impairments                       PT - Cognition Comments: pleasant and agreeable to session Following commands: Intact      Cueing Cueing Techniques: Verbal cues  Exercises Other Exercises Other Exercises: reviewed written HEP packet. Performed SAQ, alt marching, LAQ, and SLR. 15 reps with supervision    General Comments        Pertinent Vitals/Pain Pain Assessment Pain Assessment: 0-10 Pain Score: 7  Pain Location: R hip Pain Descriptors / Indicators: Operative site guarding Pain Intervention(s): Limited activity within patient's tolerance, Ice applied, Premedicated before session    Home Living                          Prior Function            PT Goals (current  goals can now be found in the care plan section) Acute Rehab PT Goals Patient Stated Goal: to go home PT Goal Formulation: With patient Time For Goal Achievement: 06/17/23 Potential to Achieve Goals: Good Progress towards PT goals: Progressing toward goals    Frequency    BID      PT Plan      Co-evaluation              AM-PAC PT "6 Clicks" Mobility   Outcome Measure  Help needed turning from your back to your side while in a flat bed without using bedrails?: None Help needed moving from lying on your back to sitting on the side of a flat bed without using  bedrails?: None Help needed moving to and from a bed to a chair (including a wheelchair)?: None Help needed standing up from a chair using your arms (e.g., wheelchair or bedside chair)?: None Help needed to walk in hospital room?: None Help needed climbing 3-5 steps with a railing? : None 6 Click Score: 24    End of Session   Activity Tolerance: Patient tolerated treatment well Patient left: in bed Nurse Communication: Mobility status PT Visit Diagnosis: Muscle weakness (generalized) (M62.81);Difficulty in walking, not elsewhere classified (R26.2);Pain Pain - Right/Left: Right Pain - part of body: Hip     Time: 0905-0928 PT Time Calculation (min) (ACUTE ONLY): 23 min  Charges:    $Gait Training: 8-22 mins $Therapeutic Exercise: 8-22 mins PT General Charges $$ ACUTE PT VISIT: 1 Visit                     Elizabeth Palau, PT, DPT, GCS [phone]    Howell Groesbeck 06/04/2023, 11:55 AM

## 2023-06-04 NOTE — Plan of Care (Signed)
  Problem: Activity: Goal: Ability to avoid complications of mobility impairment will improve Outcome: Progressing Goal: Ability to tolerate increased activity will improve Outcome: Progressing   Problem: Pain Management: Goal: Pain level will decrease with appropriate interventions Outcome: Progressing   

## 2023-06-05 LAB — SURGICAL PATHOLOGY

## 2023-08-08 ENCOUNTER — Other Ambulatory Visit: Payer: Self-pay | Admitting: Gerontology

## 2023-08-08 ENCOUNTER — Ambulatory Visit
Admission: RE | Admit: 2023-08-08 | Discharge: 2023-08-08 | Disposition: A | Discharge status: Home / Self Care | Source: Ambulatory Visit | Attending: Gerontology | Admitting: Gerontology

## 2023-08-08 DIAGNOSIS — R10814 Left lower quadrant abdominal tenderness: Secondary | ICD-10-CM

## 2023-08-08 DIAGNOSIS — N838 Other noninflammatory disorders of ovary, fallopian tube and broad ligament: Secondary | ICD-10-CM

## 2023-08-09 ENCOUNTER — Other Ambulatory Visit: Payer: Self-pay | Admitting: Gerontology

## 2023-08-09 DIAGNOSIS — R634 Abnormal weight loss: Secondary | ICD-10-CM

## 2023-08-09 DIAGNOSIS — N949 Unspecified condition associated with female genital organs and menstrual cycle: Secondary | ICD-10-CM

## 2023-08-09 DIAGNOSIS — N838 Other noninflammatory disorders of ovary, fallopian tube and broad ligament: Secondary | ICD-10-CM

## 2023-08-13 ENCOUNTER — Ambulatory Visit

## 2023-08-14 ENCOUNTER — Ambulatory Visit
Admission: RE | Admit: 2023-08-14 | Discharge: 2023-08-14 | Disposition: A | Discharge status: Home / Self Care | Source: Ambulatory Visit | Attending: Gerontology | Admitting: Gerontology

## 2023-08-14 DIAGNOSIS — N838 Other noninflammatory disorders of ovary, fallopian tube and broad ligament: Secondary | ICD-10-CM | POA: Insufficient documentation

## 2023-08-14 DIAGNOSIS — N949 Unspecified condition associated with female genital organs and menstrual cycle: Secondary | ICD-10-CM | POA: Diagnosis present

## 2023-08-14 DIAGNOSIS — R634 Abnormal weight loss: Secondary | ICD-10-CM | POA: Insufficient documentation

## 2023-08-14 MED ORDER — GADOBUTROL 1 MMOL/ML IV SOLN
7.0000 mL | Freq: Once | INTRAVENOUS | Status: AC | PRN
  Administered 2023-08-14: 7 mL via INTRAVENOUS

## 2023-08-29 ENCOUNTER — Encounter: Payer: Self-pay | Admitting: Gerontology

## 2023-08-29 ENCOUNTER — Other Ambulatory Visit: Payer: Self-pay | Admitting: Gerontology

## 2023-08-29 DIAGNOSIS — N644 Mastodynia: Secondary | ICD-10-CM

## 2023-09-05 ENCOUNTER — Ambulatory Visit
Admission: RE | Admit: 2023-09-05 | Discharge: 2023-09-05 | Discharge status: Home / Self Care | Source: Ambulatory Visit | Attending: Gerontology

## 2023-09-05 ENCOUNTER — Ambulatory Visit
Admission: RE | Admit: 2023-09-05 | Discharge: 2023-09-05 | Disposition: A | Discharge status: Home / Self Care | Source: Ambulatory Visit | Attending: Gerontology | Admitting: Gerontology

## 2023-09-05 DIAGNOSIS — N644 Mastodynia: Secondary | ICD-10-CM

## 2024-01-21 ENCOUNTER — Encounter: Payer: Self-pay | Admitting: *Deleted
